# Patient Record
Sex: Female | Born: 1940 | Race: White | Hispanic: No | Smoking: Never smoker
Health system: Southern US, Community
[De-identification: ages and names within clinical notes are randomized; demographics above are authoritative.]

## PROBLEM LIST (undated history)

## (undated) DIAGNOSIS — I10 Essential (primary) hypertension: Secondary | ICD-10-CM

## (undated) DIAGNOSIS — Z972 Presence of dental prosthetic device (complete) (partial): Secondary | ICD-10-CM

## (undated) DIAGNOSIS — K219 Gastro-esophageal reflux disease without esophagitis: Secondary | ICD-10-CM

## (undated) DIAGNOSIS — K279 Peptic ulcer, site unspecified, unspecified as acute or chronic, without hemorrhage or perforation: Secondary | ICD-10-CM

## (undated) DIAGNOSIS — E7800 Pure hypercholesterolemia, unspecified: Secondary | ICD-10-CM

## (undated) DIAGNOSIS — Z973 Presence of spectacles and contact lenses: Secondary | ICD-10-CM

## (undated) DIAGNOSIS — K859 Acute pancreatitis without necrosis or infection, unspecified: Secondary | ICD-10-CM

## (undated) DIAGNOSIS — R7303 Prediabetes: Secondary | ICD-10-CM

## (undated) DIAGNOSIS — N289 Disorder of kidney and ureter, unspecified: Secondary | ICD-10-CM

## (undated) DIAGNOSIS — N200 Calculus of kidney: Secondary | ICD-10-CM

## (undated) DIAGNOSIS — K08109 Complete loss of teeth, unspecified cause, unspecified class: Secondary | ICD-10-CM

## (undated) HISTORY — DX: Disorder of kidney and ureter, unspecified: N28.9

## (undated) HISTORY — PX: HERNIA REPAIR: SHX51

## (undated) HISTORY — PX: APPENDECTOMY: SHX54

## (undated) HISTORY — PX: TUBAL LIGATION: SHX77

## (undated) HISTORY — PX: ABDOMINAL HYSTERECTOMY: SHX81

## (undated) HISTORY — DX: Calculus of kidney: N20.0

## (undated) HISTORY — PX: CHOLECYSTECTOMY: SHX55

## (undated) HISTORY — DX: Acute pancreatitis without necrosis or infection, unspecified: K85.90

## (undated) HISTORY — DX: Peptic ulcer, site unspecified, unspecified as acute or chronic, without hemorrhage or perforation: K27.9

---

## 2000-05-31 ENCOUNTER — Emergency Department (HOSPITAL_COMMUNITY): Admission: EM | Admit: 2000-05-31 | Discharge: 2000-05-31 | Payer: Self-pay | Admitting: Emergency Medicine

## 2000-05-31 ENCOUNTER — Encounter: Payer: Self-pay | Admitting: Emergency Medicine

## 2000-06-11 ENCOUNTER — Other Ambulatory Visit: Admission: RE | Admit: 2000-06-11 | Discharge: 2000-06-11 | Payer: Self-pay | Admitting: Surgery

## 2000-06-11 ENCOUNTER — Encounter (INDEPENDENT_AMBULATORY_CARE_PROVIDER_SITE_OTHER): Payer: Self-pay | Admitting: Specialist

## 2009-02-09 ENCOUNTER — Encounter: Admission: RE | Admit: 2009-02-09 | Discharge: 2009-02-09 | Payer: Self-pay | Admitting: Family Medicine

## 2009-02-10 ENCOUNTER — Encounter: Admission: RE | Admit: 2009-02-10 | Discharge: 2009-02-10 | Payer: Self-pay | Admitting: Family Medicine

## 2009-02-10 ENCOUNTER — Encounter: Payer: Self-pay | Admitting: Pulmonary Disease

## 2009-03-02 ENCOUNTER — Ambulatory Visit: Payer: Self-pay | Admitting: Pulmonary Disease

## 2009-03-02 DIAGNOSIS — R930 Abnormal findings on diagnostic imaging of skull and head, not elsewhere classified: Secondary | ICD-10-CM | POA: Insufficient documentation

## 2009-03-02 DIAGNOSIS — E785 Hyperlipidemia, unspecified: Secondary | ICD-10-CM

## 2009-03-02 DIAGNOSIS — I10 Essential (primary) hypertension: Secondary | ICD-10-CM | POA: Insufficient documentation

## 2009-04-08 ENCOUNTER — Encounter: Admission: RE | Admit: 2009-04-08 | Discharge: 2009-04-08 | Payer: Self-pay | Admitting: Family Medicine

## 2009-09-20 ENCOUNTER — Encounter: Admission: RE | Admit: 2009-09-20 | Discharge: 2009-09-20 | Payer: Self-pay | Admitting: Family Medicine

## 2010-10-29 IMAGING — CR DG CHEST 2V
2 series · 2 of 2 positions shown · non-contrast
Comparison: None.

CLINICAL DATA: Cough.

CHEST - 2 VIEW

[view not recorded (1 of 2)]
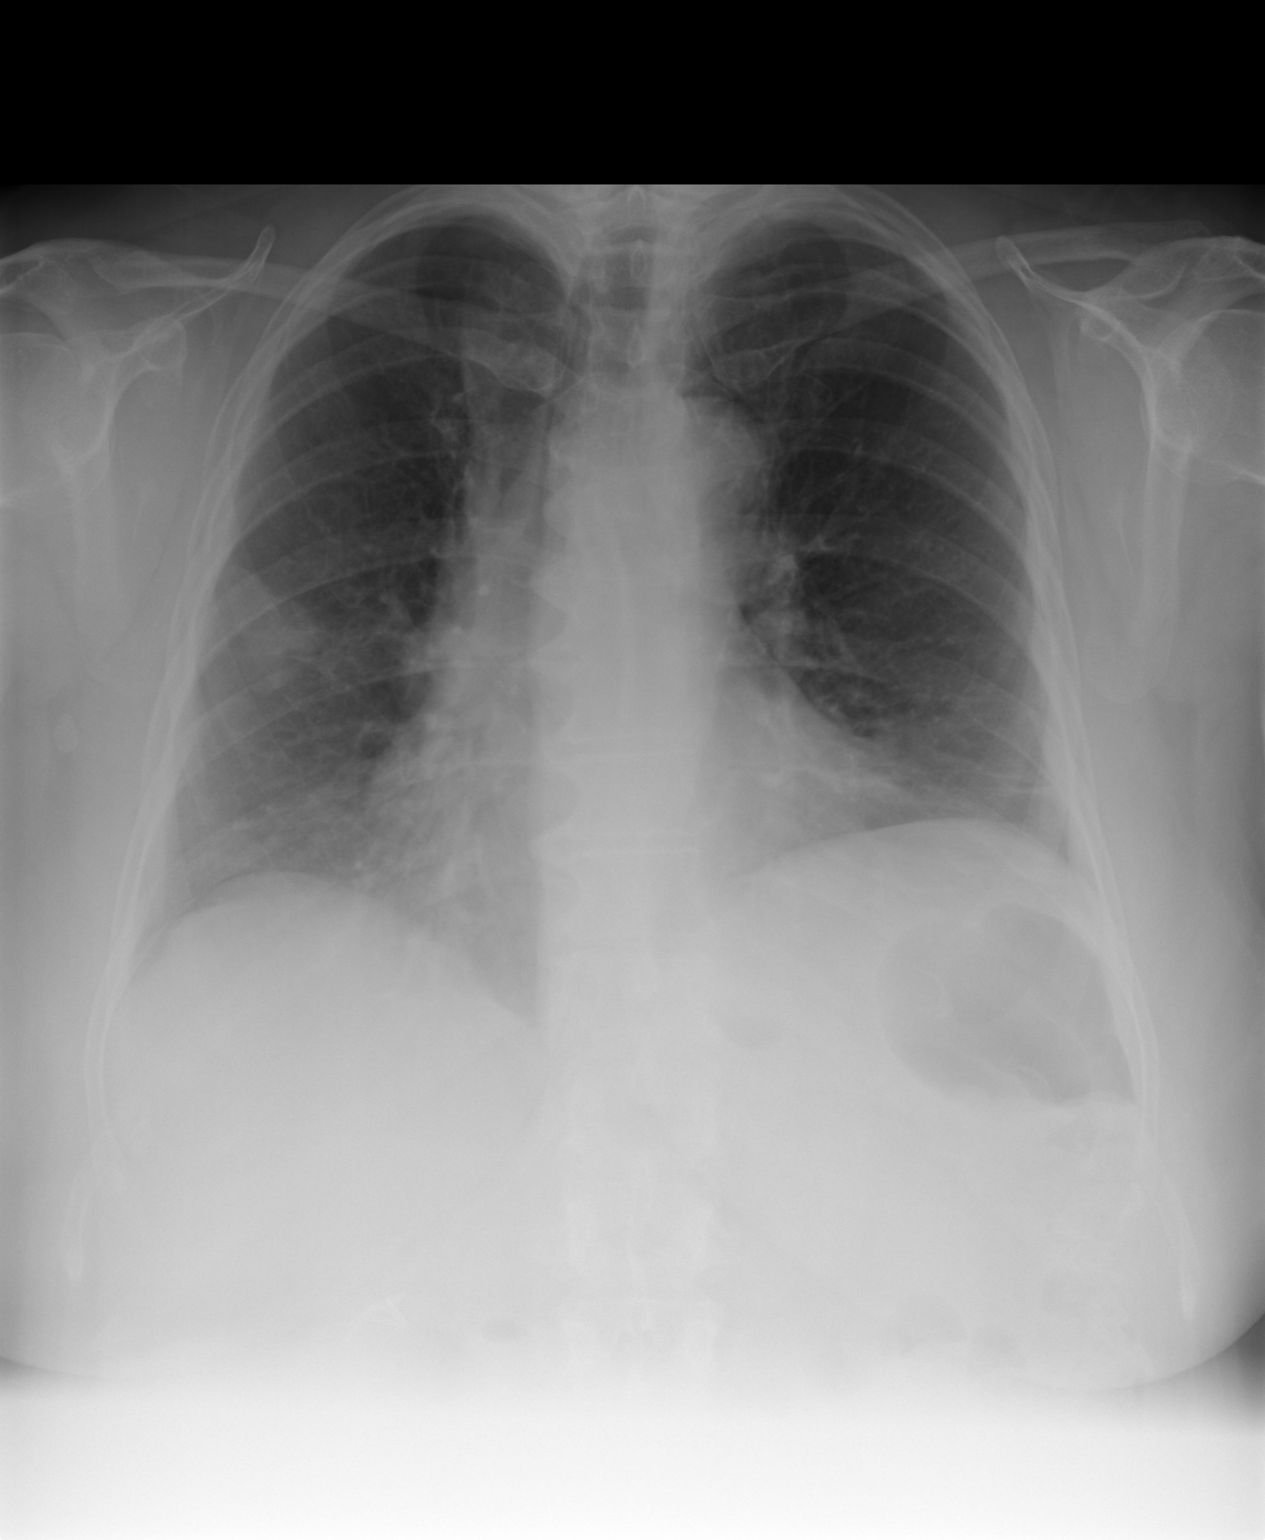

[view not recorded (2 of 2)]
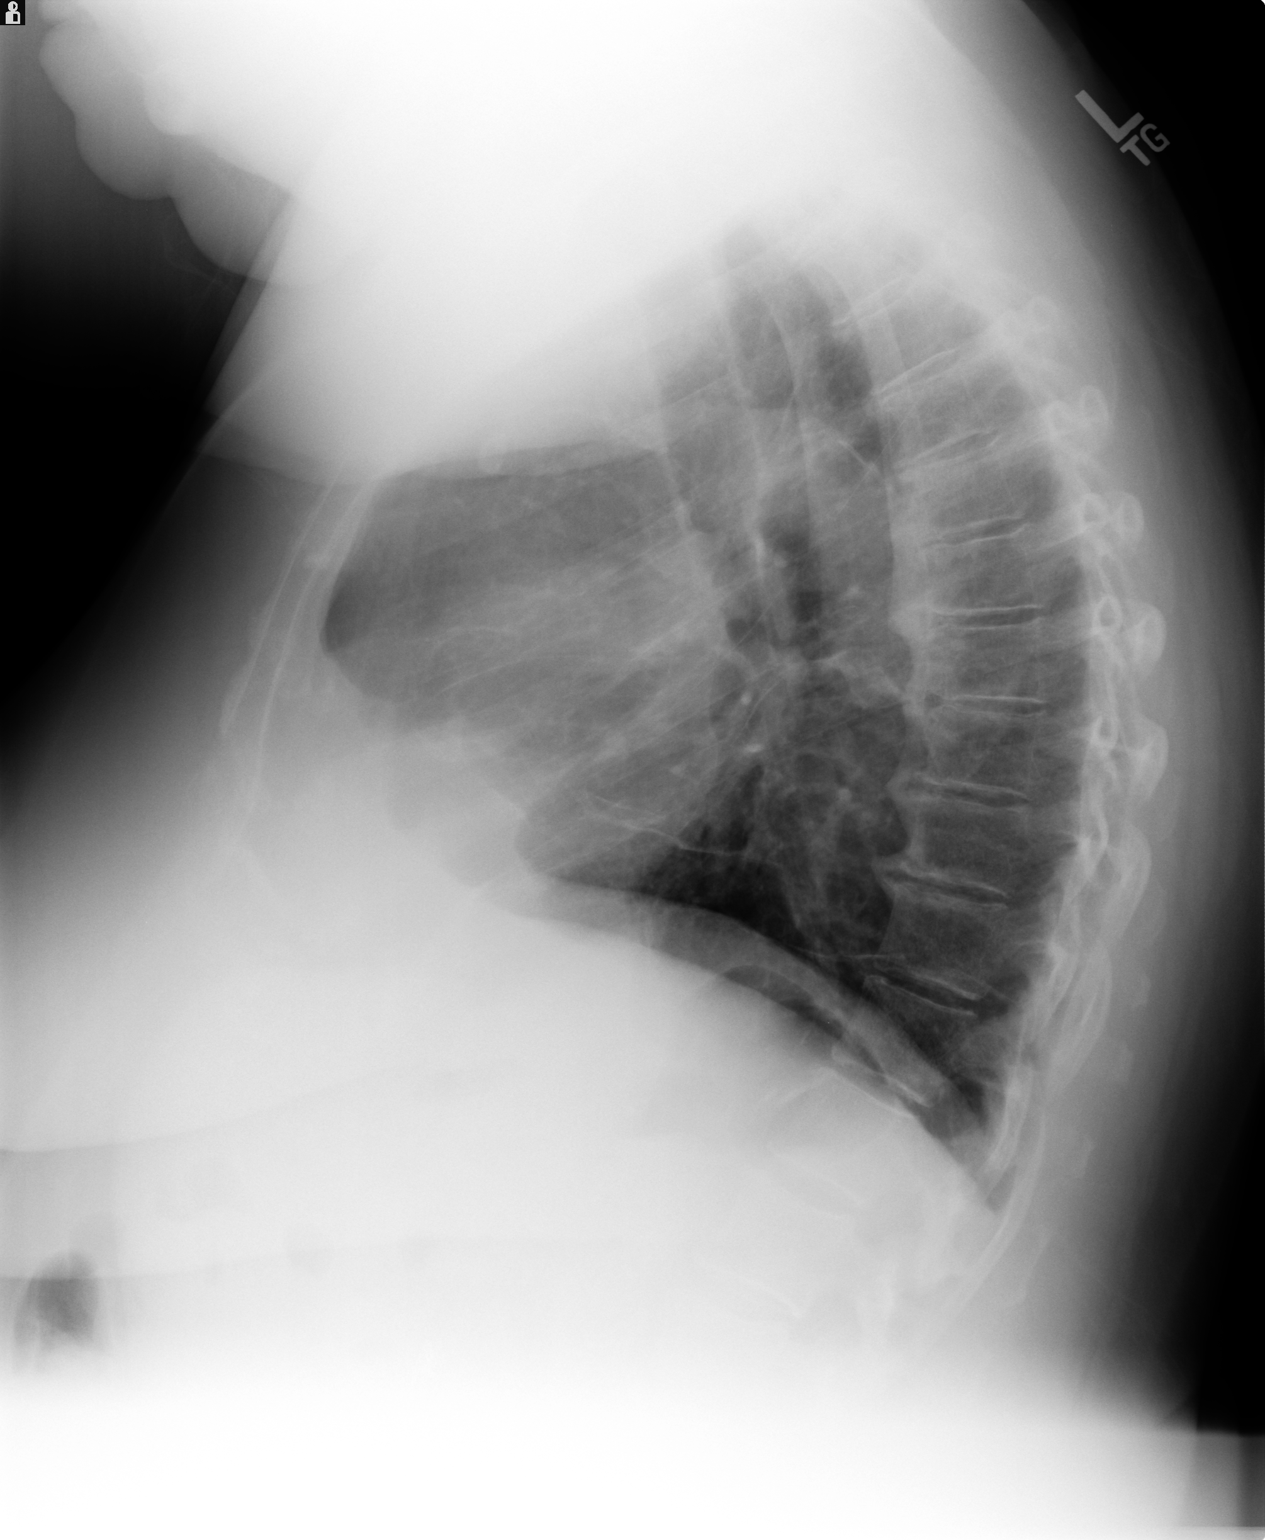

[2 of 2 positions shown; findings below may reference images not displayed]

FINDINGS: Trachea is midline.  Heart is at the upper limits of
normal in size, to mildly enlarged.  Linear densities in the
lingula are likely due to scarring, adjacent to an elevated left
hemidiaphragm.  There is a rounded opacity in the lateral right mid
lung zone.  Lungs are otherwise clear.  No pleural fluid.
IMPRESSION: Round density in the right mid lung zone.  A mass is considered.
Comparison with prior exams would be helpful.  If old exams are not
available, chest CT with contrast is recommended.  This was
discussed with Kebeli, in Dr. [REDACTED], at 8533 hours on
02/09/2009.

## 2012-01-01 ENCOUNTER — Other Ambulatory Visit: Payer: Self-pay | Admitting: Rheumatology

## 2012-01-01 DIAGNOSIS — M25562 Pain in left knee: Secondary | ICD-10-CM

## 2012-01-03 ENCOUNTER — Ambulatory Visit
Admission: RE | Admit: 2012-01-03 | Discharge: 2012-01-03 | Disposition: A | Discharge status: Home / Self Care | Payer: Medicare Other | Source: Ambulatory Visit | Attending: Rheumatology | Admitting: Rheumatology

## 2012-01-03 DIAGNOSIS — M25562 Pain in left knee: Secondary | ICD-10-CM

## 2012-01-09 ENCOUNTER — Encounter (HOSPITAL_COMMUNITY): Payer: Self-pay | Admitting: Emergency Medicine

## 2012-01-09 ENCOUNTER — Emergency Department (HOSPITAL_COMMUNITY): Payer: Medicare Other

## 2012-01-09 ENCOUNTER — Inpatient Hospital Stay (HOSPITAL_COMMUNITY)
Admission: EM | Admit: 2012-01-09 | Discharge: 2012-01-13 | DRG: 439 | Disposition: A | Discharge status: Home / Self Care | Payer: Medicare Other | Attending: Internal Medicine | Admitting: Internal Medicine

## 2012-01-09 DIAGNOSIS — K219 Gastro-esophageal reflux disease without esophagitis: Secondary | ICD-10-CM | POA: Diagnosis present

## 2012-01-09 DIAGNOSIS — E785 Hyperlipidemia, unspecified: Secondary | ICD-10-CM | POA: Diagnosis present

## 2012-01-09 DIAGNOSIS — K859 Acute pancreatitis without necrosis or infection, unspecified: Principal | ICD-10-CM

## 2012-01-09 DIAGNOSIS — R109 Unspecified abdominal pain: Secondary | ICD-10-CM

## 2012-01-09 DIAGNOSIS — K560 Paralytic ileus: Secondary | ICD-10-CM | POA: Diagnosis present

## 2012-01-09 DIAGNOSIS — E7800 Pure hypercholesterolemia, unspecified: Secondary | ICD-10-CM | POA: Diagnosis present

## 2012-01-09 DIAGNOSIS — R911 Solitary pulmonary nodule: Secondary | ICD-10-CM | POA: Diagnosis present

## 2012-01-09 DIAGNOSIS — Z79899 Other long term (current) drug therapy: Secondary | ICD-10-CM

## 2012-01-09 DIAGNOSIS — E669 Obesity, unspecified: Secondary | ICD-10-CM | POA: Diagnosis present

## 2012-01-09 DIAGNOSIS — D72829 Elevated white blood cell count, unspecified: Secondary | ICD-10-CM | POA: Diagnosis present

## 2012-01-09 DIAGNOSIS — Z23 Encounter for immunization: Secondary | ICD-10-CM

## 2012-01-09 DIAGNOSIS — Z6841 Body Mass Index (BMI) 40.0 and over, adult: Secondary | ICD-10-CM

## 2012-01-09 DIAGNOSIS — I10 Essential (primary) hypertension: Secondary | ICD-10-CM | POA: Diagnosis present

## 2012-01-09 DIAGNOSIS — D1779 Benign lipomatous neoplasm of other sites: Secondary | ICD-10-CM | POA: Diagnosis present

## 2012-01-09 DIAGNOSIS — R930 Abnormal findings on diagnostic imaging of skull and head, not elsewhere classified: Secondary | ICD-10-CM

## 2012-01-09 HISTORY — DX: Pure hypercholesterolemia, unspecified: E78.00

## 2012-01-09 HISTORY — DX: Gastro-esophageal reflux disease without esophagitis: K21.9

## 2012-01-09 HISTORY — DX: Prediabetes: R73.03

## 2012-01-09 HISTORY — DX: Essential (primary) hypertension: I10

## 2012-01-09 LAB — CBC WITH DIFFERENTIAL/PLATELET
Basophils Relative: 0 % (ref 0–1)
Eosinophils Absolute: 0.1 10*3/uL (ref 0.0–0.7)
Eosinophils Relative: 1 % (ref 0–5)
MCH: 27.8 pg (ref 26.0–34.0)
MCHC: 33.1 g/dL (ref 30.0–36.0)
MCV: 84 fL (ref 78.0–100.0)
Monocytes Relative: 7 % (ref 3–12)
Neutrophils Relative %: 86 % — ABNORMAL HIGH (ref 43–77)
Platelets: 238 10*3/uL (ref 150–400)

## 2012-01-09 LAB — COMPREHENSIVE METABOLIC PANEL
Albumin: 3.4 g/dL — ABNORMAL LOW (ref 3.5–5.2)
Alkaline Phosphatase: 81 U/L (ref 39–117)
BUN: 26 mg/dL — ABNORMAL HIGH (ref 6–23)
Calcium: 9.5 mg/dL (ref 8.4–10.5)
GFR calc Af Amer: 62 mL/min — ABNORMAL LOW (ref >90)
Potassium: 3.8 mEq/L (ref 3.5–5.1)
Sodium: 133 mEq/L — ABNORMAL LOW (ref 135–145)
Total Protein: 7.4 g/dL (ref 6.0–8.3)

## 2012-01-09 LAB — URINALYSIS, MICROSCOPIC ONLY
Ketones, ur: NEGATIVE mg/dL
Nitrite: NEGATIVE
Protein, ur: 30 mg/dL — AB

## 2012-01-09 LAB — CBC
HCT: 35.6 % — ABNORMAL LOW (ref 36.0–46.0)
Platelets: 184 10*3/uL (ref 150–400)
RDW: 16.5 % — ABNORMAL HIGH (ref 11.5–15.5)
WBC: 19.8 10*3/uL — ABNORMAL HIGH (ref 4.0–10.5)

## 2012-01-09 LAB — LIPID PANEL
LDL Cholesterol: 95 mg/dL (ref 0–99)
Total CHOL/HDL Ratio: 2.5 RATIO

## 2012-01-09 LAB — LIPASE, BLOOD: Lipase: 201 U/L — ABNORMAL HIGH (ref 11–59)

## 2012-01-09 LAB — CREATININE, SERUM
GFR calc Af Amer: 71 mL/min — ABNORMAL LOW (ref >90)
GFR calc non Af Amer: 62 mL/min — ABNORMAL LOW (ref >90)

## 2012-01-09 MED ORDER — SIMVASTATIN 20 MG PO TABS
20.0000 mg | ORAL_TABLET | Freq: Every day | ORAL | Status: DC
  Administered 2012-01-09 – 2012-01-12 (×4): 20 mg via ORAL
  Filled 2012-01-09 (×5): qty 1

## 2012-01-09 MED ORDER — MORPHINE SULFATE 4 MG/ML IJ SOLN
4.0000 mg | INTRAMUSCULAR | Status: DC | PRN
  Administered 2012-01-09 – 2012-01-12 (×10): 4 mg via INTRAVENOUS
  Filled 2012-01-09 (×10): qty 1

## 2012-01-09 MED ORDER — PNEUMOCOCCAL VAC POLYVALENT 25 MCG/0.5ML IJ INJ
0.5000 mL | INJECTION | Freq: Once | INTRAMUSCULAR | Status: AC
  Administered 2012-01-10: 0.5 mL via INTRAMUSCULAR
  Filled 2012-01-09: qty 0.5

## 2012-01-09 MED ORDER — HYDROCODONE-ACETAMINOPHEN 5-325 MG PO TABS
1.0000 | ORAL_TABLET | ORAL | Status: DC | PRN
  Administered 2012-01-10: 1 via ORAL
  Filled 2012-01-09: qty 1

## 2012-01-09 MED ORDER — HYDROMORPHONE HCL PF 1 MG/ML IJ SOLN
0.5000 mg | Freq: Once | INTRAMUSCULAR | Status: AC
  Administered 2012-01-09: 0.5 mg via INTRAVENOUS
  Filled 2012-01-09: qty 1

## 2012-01-09 MED ORDER — AMLODIPINE BESYLATE 5 MG PO TABS
5.0000 mg | ORAL_TABLET | Freq: Every day | ORAL | Status: DC
  Administered 2012-01-09 – 2012-01-13 (×5): 5 mg via ORAL
  Filled 2012-01-09 (×5): qty 1

## 2012-01-09 MED ORDER — ENOXAPARIN SODIUM 40 MG/0.4ML ~~LOC~~ SOLN
40.0000 mg | SUBCUTANEOUS | Status: DC
  Administered 2012-01-09 – 2012-01-13 (×5): 40 mg via SUBCUTANEOUS
  Filled 2012-01-09 (×6): qty 0.4

## 2012-01-09 MED ORDER — GI COCKTAIL ~~LOC~~
30.0000 mL | Freq: Once | ORAL | Status: AC
  Administered 2012-01-09: 30 mL via ORAL
  Filled 2012-01-09: qty 30

## 2012-01-09 MED ORDER — PANTOPRAZOLE SODIUM 40 MG IV SOLR
40.0000 mg | INTRAVENOUS | Status: DC
  Administered 2012-01-09 – 2012-01-13 (×5): 40 mg via INTRAVENOUS
  Filled 2012-01-09 (×5): qty 40

## 2012-01-09 MED ORDER — DIPHENHYDRAMINE HCL 50 MG/ML IJ SOLN
25.0000 mg | Freq: Once | INTRAMUSCULAR | Status: AC
  Administered 2012-01-09: 25 mg via INTRAVENOUS
  Filled 2012-01-09: qty 1

## 2012-01-09 MED ORDER — ONDANSETRON HCL 4 MG/2ML IJ SOLN
4.0000 mg | Freq: Once | INTRAMUSCULAR | Status: AC
  Administered 2012-01-09: 4 mg via INTRAVENOUS
  Filled 2012-01-09: qty 2

## 2012-01-09 MED ORDER — IRBESARTAN 150 MG PO TABS
150.0000 mg | ORAL_TABLET | Freq: Every day | ORAL | Status: DC
  Administered 2012-01-10 – 2012-01-13 (×4): 150 mg via ORAL
  Filled 2012-01-09 (×5): qty 1

## 2012-01-09 MED ORDER — ONDANSETRON HCL 4 MG/2ML IJ SOLN
4.0000 mg | Freq: Four times a day (QID) | INTRAMUSCULAR | Status: DC | PRN
  Administered 2012-01-09 – 2012-01-12 (×2): 4 mg via INTRAVENOUS
  Filled 2012-01-09 (×2): qty 2

## 2012-01-09 MED ORDER — ACETAMINOPHEN 650 MG RE SUPP: 650.0000 mg | Freq: Four times a day (QID) | RECTAL | Status: DC | PRN

## 2012-01-09 MED ORDER — SODIUM CHLORIDE 0.9 % IV SOLN
INTRAVENOUS | Status: DC
  Administered 2012-01-09 – 2012-01-12 (×7): via INTRAVENOUS

## 2012-01-09 MED ORDER — ACETAMINOPHEN 325 MG PO TABS: 650.0000 mg | ORAL_TABLET | Freq: Four times a day (QID) | ORAL | Status: DC | PRN

## 2012-01-09 MED ORDER — MORPHINE SULFATE 4 MG/ML IJ SOLN
4.0000 mg | Freq: Once | INTRAMUSCULAR | Status: AC
  Administered 2012-01-09: 4 mg via INTRAVENOUS
  Filled 2012-01-09: qty 1

## 2012-01-09 NOTE — ED Notes (Signed)
Patient transported to CT 

## 2012-01-09 NOTE — ED Notes (Signed)
PT back from CT.  States pain not improved.  Pt w/localized welts and redness to IV site.

## 2012-01-09 NOTE — ED Notes (Signed)
Went to give pt medication but she is at [employer].

## 2012-01-09 NOTE — ED Notes (Signed)
Pt c/o epigastric pain and pain in mid back onset yesterday.  St's she has hx of acid reflux and has been on a lot of meds recently due to a knee injury.  Pt also c/o a bitter taste in mouth

## 2012-01-09 NOTE — ED Notes (Signed)
Pt states pain not relieved.  Dr Norlene Campbell notified.  Redness/welts reduced.

## 2012-01-09 NOTE — H&P (Signed)
[facility] History and Physical  [name] [name] JYN:[id number] DOB: [birth year] DOA: 01/09/2012  Referring physician: ED physician PCP: Sissy Hoff, MD   Chief Complaint: Abdominal pain  HPI:  This is a 71 year old female with a 30 hour history of abdominal pain. She was in her usual state of health until this period of time. Her abdominal pain is epigastric and radiates to her back. She denies any vomiting but complains of nausea. She denies any illegal drug use or alcohol. She has not started any new medications. There are no fevers, chills, chest pain, shortness of breath, rashes, hematochezia, melena, headaches, dysphagia, or odynophagia. She had a cholecystectomy in January 2002. In the emergency R., the patient received hydromorphone for pain. Her current pain level is 4/10. She denies any dysuria or hematuria.  Assessment and Plan: #1 acute pancreatitis. This is likely due to hydrochlorothiazide. The patient is status post cholecystectomy. The patient will be started on intravenous fluids as well as intravenous pain medications. The patient states that she can tolerate some clear liquids, and I will start. I will convert her to n.p.o. should she start having vomiting. CT of the abdomen and pelvis did not show any biliary ductal dilatation. #2 leukocytosis. This is likely reactive to the pancreatitis. Blood cultures and urine cultures will be obtained #3 hyperlipidemia-a lipid panel will be checked for possible cause of her pancreatitis #4 hypertension. The patient has not taken her blood pressure medicines today. I will stop hydrochlorothiazide but continue her angiotensin receptor blocker. I will add amlodipine for better blood pressure control.  Code Status: Full Family Communication: Pt at bedside Disposition Plan: PT evaluation    Review of Systems:  Constitutional: Negative for fever, chills and malaise/fatigue. Negative for diaphoresis.  HENT: Negative for hearing loss,  ear pain, nosebleeds, congestion, sore throat, neck pain, tinnitus and ear discharge.   Eyes: Negative for blurred vision, double vision, photophobia, pain, discharge and redness.  Respiratory: Negative for cough, hemoptysis, sputum production, shortness of breath, wheezing and stridor.   Cardiovascular: Negative for chest pain, palpitations, orthopnea, claudication and leg swelling.  Gastrointestinal: Negative for nausea,. Negative for heartburn, constipation, blood in stool and melena.  Genitourinary: Negative for dysuria, urgency, frequency, hematuria and flank pain.  Musculoskeletal: Negative for myalgias, back pain, joint pain and falls.  Skin: Negative for itching and rash.  Neurological: Negative for dizziness and weakness. Negative for tingling, tremors, sensory change, speech change, focal weakness, loss of consciousness and headaches.  Endo/Heme/Allergies: Negative for environmental allergies and polydipsia. Does not bruise/bleed easily.  Psychiatric/Behavioral: Negative for suicidal ideas. The patient is not nervous/anxious.      Past Medical History  Diagnosis Date  . GERD (gastroesophageal reflux disease)   . Hypertension   . High cholesterol     Past Surgical History  Procedure Date  . Cholecystectomy     Social History:  reports that she has never smoked. She does not have any smokeless tobacco history on file. She reports that she does not drink alcohol or use illicit drugs.  Allergies  Allergen Reactions  . Iohexol Hives  . Other Other (See Comments)    Iv dye-hives     No family history on file.  Prior to Admission medications   Medication Sig Start Date End Date Taking? Authorizing Provider  hydrochlorothiazide (HYDRODIURIL) 25 MG tablet Take 25 mg by mouth daily.   Yes Historical Provider, MD  HYDROcodone-acetaminophen (VICODIN) 5-500 MG per tablet Take 1 tablet by mouth every 6 (six) hours  as needed. For pain   Yes Historical Provider, MD  Multiple Vitamin  (MULTIVITAMIN WITH MINERALS) TABS Take 1 tablet by mouth daily.   Yes Historical Provider, MD  olmesartan (BENICAR) 20 MG tablet Take 20 mg by mouth daily.   Yes Historical Provider, MD  omeprazole (PRILOSEC) 20 MG capsule Take 20 mg by mouth daily as needed. For acid reflux   Yes Historical Provider, MD  simvastatin (ZOCOR) 20 MG tablet Take 20 mg by mouth every evening.   Yes Historical Provider, MD    Physical Exam: Filed Vitals:   01/09/12 0148 01/09/12 0519 01/09/12 0831  BP: 167/66 161/89 178/70  Pulse: 93 92 87  Temp: 98.1 F (36.7 C) 97.3 F (36.3 C)   TempSrc:  Oral   Resp: 17 19 14   SpO2: 95% 100% 98%    Physical Exam  Constitutional: Appears well-developed and well-nourished. No distress.  HENT: Normocephalic. External right and left ear normal. Oropharynx is clear and moist.  Eyes: Conjunctivae and EOM are normal. PERRLA, no scleral icterus.  Neck: Normal ROM. Neck supple. No JVD. No tracheal deviation. No thyromegaly.  CVS: RRR, S1/S2 +, no murmurs, no gallops, no carotid bruit.  Pulmonary: Effort and breath sounds normal, no stridor, rhonchi, wheezes, rales.  Abdominal: Soft. BS +,  no distension, tenderness, rebound or guarding.  Musculoskeletal: Normal range of motion. Trace edema in the bilateral lower extremities. Mild left knee pain without effusion  Lymphadenopathy: No lymphadenopathy noted, cervical, inguinal. Neuro: Alert. Normal reflexes, muscle tone coordination. No cranial nerve deficit. Skin: Skin is warm and dry. No rash noted. Not diaphoretic. No erythema. No pallor.  Psychiatric: Normal mood and affect. Behavior, judgment, thought content normal.   Labs on Admission:  Basic Metabolic Panel:  Lab 01/09/12 1610  NA 133*  K 3.8  CL 96  CO2 22  GLUCOSE 132*  BUN 26*  CREATININE 1.02  CALCIUM 9.5  MG --  PHOS --   Liver Function Tests:  Lab 01/09/12 0443  AST 13  ALT 11  ALKPHOS 81  BILITOT 0.3  PROT 7.4  ALBUMIN 3.4*    Lab 01/09/12  0443  LIPASE 201*  AMYLASE --   No results found for this basename: AMMONIA:5 in the last 168 hours CBC:  Lab 01/09/12 0443  WBC 18.6*  NEUTROABS 16.0*  HGB 11.8*  HCT 35.7*  MCV 84.0  PLT 238   Cardiac Enzymes: No results found for this basename: CKTOTAL:5,CKMB:5,CKMBINDEX:5,TROPONINI:5 in the last 168 hours BNP: No components found with this basename: POCBNP:5 CBG: No results found for this basename: GLUCAP:5 in the last 168 hours  Radiological Exams on Admission: Ct Abdomen Pelvis Wo Contrast  01/09/2012  *RADIOLOGY REPORT*  Clinical Data: Epigastric pain and back pain.  Burning sensation. Elevated lipase.  Elevated white cell count.  Allergy to IV contrast material is reported.  CT ABDOMEN AND PELVIS WITHOUT CONTRAST  Technique:  Multidetector CT imaging of the abdomen and pelvis was performed following the standard protocol without intravenous contrast.  Comparison: None.  Findings: The lung bases are clear.  Coronary artery calcification. Small esophageal hiatal hernia.  There is infiltration in the fat around the head of the pancreas with infiltration extending to the anterior pararenal spaces bilaterally.  Changes are consistent with early pancreatitis.  No pancreatic ductal dilatation.  Mild fatty infiltration of the pancreas.  Surgical absence of the gallbladder.  No bile duct dilatation. Liver, spleen, and adrenal glands are unremarkable.  Calcification of abdominal aorta without aneurysm.  Staghorn calculi in the lower pole right kidney without obstruction.  No hydronephrosis in either kidney.  Hypo attenuating lesion in the left kidney measures 11 mm and probably represents a cyst.  No retroperitoneal lymphadenopathy.  Scattered mesenteric and celiac axis lymph nodes are not enlarged and probably are reactive.  Small midline abdominal wall hernia containing fat with scarring or fat necrosis. Prominent visceral adipose tissues.  No free fluid or free air in the abdomen.  The  stomach, small bowel, and colon are decompressed.  Pelvis:  Uterus is surgically absent.  No abnormal adnexal masses. No bladder wall thickening.  No free or loculated pelvic fluid collections.  Calcifications in the pelvis consistent with phleboliths.  No diverticulitis.  The appendix is not identified. The bowel, spondylolysis and mild spondylolisthesis at L5-S1. Degenerative changes in the spine.  IMPRESSION: Inflammatory changes around the head of the pancreas consistent with early pancreatitis.  No evidence of abscess or fluid collection.  Small midline abdominal wall hernia superiorly with scarring or fat necrosis.  Staghorn type nonobstructing stone in the right kidney.  Original Report Authenticated By: Marlon Pel, M.D.   Dg Abd Acute W/chest  01/09/2012  *RADIOLOGY REPORT*  Clinical Data: Upper abdominal back pain and nausea.  ACUTE ABDOMEN SERIES (ABDOMEN 2 VIEW & CHEST 1 VIEW)  Comparison: 02/09/2009 chest x-ray  Findings: Shallow inspiration.  Linear fibrosis in the left lung base, stable.  Nodular opacity over the right mid lung is stable since the previous study and shown to represent a pleural fat collection on CT from 09/20/2009.  No focal airspace consolidation in the lungs.  No blunting of costophrenic angles.  No pneumothorax.  Mediastinal contours appear intact.  Normal heart size and pulmonary vascularity.  No significant change since previous chest radiograph.  Normal bowel gas pattern with scattered gas and stool in the colon. No small or large bowel distension.  Surgical clips in the right upper quadrant.  No free intra-abdominal air.  No abnormal air fluid levels.  Increased density in the right upper quadrant may represent renal stones.  This is stable since previous study. Degenerative changes in the spine and hips.  Calcified phleboliths in the pelvis.  IMPRESSION: Stable appearance of the chest with chronic changes as described. No evidence of active pulmonary disease.   Nonobstructive bowel gas pattern.  Calcifications in the right abdomen may represent renal stones and are stable since previous study.  No free air.  Original Report Authenticated By: Marlon Pel, M.D.    EKG: Normal sinus rhythm, no ST/T wave changes  Terrin Imparato, MD  [facility] Pager [phone]  If 7PM-7AM, please contact night-coverage [email or web address] Password [facility] 01/09/2012, 8:55 AM

## 2012-01-09 NOTE — Progress Notes (Signed)
Arrived to room 6N#2.  A/ox4, daughter at bedside, c/o abd pain 5/10 denies nausea, moved self from stretcher to bed without difficulty. Oriented to room and surroundings.

## 2012-01-09 NOTE — ED Provider Notes (Addendum)
History     CSN: [id number]  Arrival date & time 01/09/12  0146   First MD Initiated Contact with Patient 01/09/12 [phone]      Chief Complaint  Patient presents with  . Abdominal Pain    (Consider location/radiation/quality/duration/timing/severity/associated sxs/prior treatment) HPI 71 year old female presents to emergency room complaining of upper abdominal pain with radiation into her back. Patient reports she woke yesterday morning around 4 AM with acute onset of pain. She has had nausea but no vomiting. She had normal bowel movement yesterday. She denies any fever or chills. No prior history of similar pain. Patient feels loaded with a distended stomach. Patient does have a history of GERD, but reports this is much worse in any prior attempts of GERD. Patient has tried Designer, fashion/clothing and Maisie Fus without improvement. Patient denies any recent NSAID use. No prior history of gastric ulcers, no history of pancreatitis, no history of aortic aneurysms. Patient has history of cholecystectomy, appendectomy, and hysterectomy. No prior history of small bowel obstruction.  Past Medical History  Diagnosis Date  . GERD (gastroesophageal reflux disease)   . Hypertension   . High cholesterol     Past Surgical History  Procedure Date  . Cholecystectomy     No family history on file.  History  Substance Use Topics  . Smoking status: Never Smoker   . Smokeless tobacco: Not on file  . Alcohol Use: No    OB History    Grav Para Term Preterm Abortions TAB SAB Ect Mult Living                  Review of Systems  All other systems reviewed and are negative.    Allergies  Iohexol and Other  Home Medications   Current Outpatient Rx  Name Route Sig Dispense Refill  . HYDROCHLOROTHIAZIDE 25 MG PO TABS Oral Take 25 mg by mouth daily.    Marland Kitchen HYDROCODONE-ACETAMINOPHEN 5-500 MG PO TABS Oral Take 1 tablet by mouth every 6 (six) hours as needed. For pain    . ADULT MULTIVITAMIN W/MINERALS CH Oral  Take 1 tablet by mouth daily.    Marland Kitchen OLMESARTAN MEDOXOMIL 20 MG PO TABS Oral Take 20 mg by mouth daily.    Marland Kitchen OMEPRAZOLE 20 MG PO CPDR Oral Take 20 mg by mouth daily as needed. For acid reflux    . SIMVASTATIN 20 MG PO TABS Oral Take 20 mg by mouth every evening.      BP 161/89  Pulse 92  Temp 97.3 F (36.3 C) (Oral)  Resp 19  SpO2 100%  Physical Exam  Nursing note and vitals reviewed. Constitutional: She appears well-developed and well-nourished.       Obese, uncomfortable appearing  HENT:  Head: Normocephalic and atraumatic.  Nose: Nose normal.  Mouth/Throat: Oropharynx is clear and moist.  Eyes: Conjunctivae and EOM are normal. Pupils are equal, round, and reactive to light.  Neck: Normal range of motion. Neck supple. No JVD present. No tracheal deviation present. No thyromegaly present.  Cardiovascular: Normal rate, regular rhythm, normal heart sounds and intact distal pulses.  Exam reveals no gallop and no friction rub.   No murmur heard. Pulmonary/Chest: Effort normal and breath sounds normal. No stridor. No respiratory distress. She has no wheezes. She has no rales. She exhibits no tenderness.  Abdominal: Soft. Bowel sounds are normal. She exhibits distension. She exhibits no mass. There is tenderness (exquisite tenderness in epigastrium with voluntary guarding but no rebound). There is guarding. There is no  rebound.  Musculoskeletal: Normal range of motion. She exhibits tenderness (pain with palpation of left knee). She exhibits no edema.  Lymphadenopathy:    She has no cervical adenopathy.  Neurological: She is alert. She exhibits normal muscle tone. Coordination normal.  Skin: Skin is dry. No rash noted. No erythema. No pallor.  Psychiatric: She has a normal mood and affect. Her behavior is normal. Judgment and thought content normal.    ED Course  Procedures (including critical care time)  Labs Reviewed  CBC WITH DIFFERENTIAL - Abnormal; Notable for the following:     WBC 18.6 (*)     Hemoglobin 11.8 (*)     HCT 35.7 (*)     RDW 16.6 (*)     Neutrophils Relative 86 (*)     Neutro Abs 16.0 (*)     Lymphocytes Relative 6 (*)     Monocytes Absolute 1.3 (*)     All other components within normal limits  COMPREHENSIVE METABOLIC PANEL - Abnormal; Notable for the following:    Sodium 133 (*)     Glucose, Bld 132 (*)     BUN 26 (*)     Albumin 3.4 (*)     GFR calc non Af Amer 54 (*)     GFR calc Af Amer 62 (*)     All other components within normal limits  LIPASE, BLOOD - Abnormal; Notable for the following:    Lipase 201 (*)     All other components within normal limits  LACTIC ACID, PLASMA   Ct Abdomen Pelvis Wo Contrast  01/09/2012  *RADIOLOGY REPORT*  Clinical Data: Epigastric pain and back pain.  Burning sensation. Elevated lipase.  Elevated white cell count.  Allergy to IV contrast material is reported.  CT ABDOMEN AND PELVIS WITHOUT CONTRAST  Technique:  Multidetector CT imaging of the abdomen and pelvis was performed following the standard protocol without intravenous contrast.  Comparison: None.  Findings: The lung bases are clear.  Coronary artery calcification. Small esophageal hiatal hernia.  There is infiltration in the fat around the head of the pancreas with infiltration extending to the anterior pararenal spaces bilaterally.  Changes are consistent with early pancreatitis.  No pancreatic ductal dilatation.  Mild fatty infiltration of the pancreas.  Surgical absence of the gallbladder.  No bile duct dilatation. Liver, spleen, and adrenal glands are unremarkable.  Calcification of abdominal aorta without aneurysm.  Staghorn calculi in the lower pole right kidney without obstruction.  No hydronephrosis in either kidney.  Hypo attenuating lesion in the left kidney measures 11 mm and probably represents a cyst.  No retroperitoneal lymphadenopathy.  Scattered mesenteric and celiac axis lymph nodes are not enlarged and probably are reactive.  Small midline  abdominal wall hernia containing fat with scarring or fat necrosis. Prominent visceral adipose tissues.  No free fluid or free air in the abdomen.  The stomach, small bowel, and colon are decompressed.  Pelvis:  Uterus is surgically absent.  No abnormal adnexal masses. No bladder wall thickening.  No free or loculated pelvic fluid collections.  Calcifications in the pelvis consistent with phleboliths.  No diverticulitis.  The appendix is not identified. The bowel, spondylolysis and mild spondylolisthesis at L5-S1. Degenerative changes in the spine.  IMPRESSION: Inflammatory changes around the head of the pancreas consistent with early pancreatitis.  No evidence of abscess or fluid collection.  Small midline abdominal wall hernia superiorly with scarring or fat necrosis.  Staghorn type nonobstructing stone in the right kidney.  Original Report Authenticated By: Marlon Pel, M.D.   Dg Abd Acute W/chest  01/09/2012  *RADIOLOGY REPORT*  Clinical Data: Upper abdominal back pain and nausea.  ACUTE ABDOMEN SERIES (ABDOMEN 2 VIEW & CHEST 1 VIEW)  Comparison: 02/09/2009 chest x-ray  Findings: Shallow inspiration.  Linear fibrosis in the left lung base, stable.  Nodular opacity over the right mid lung is stable since the previous study and shown to represent a pleural fat collection on CT from 09/20/2009.  No focal airspace consolidation in the lungs.  No blunting of costophrenic angles.  No pneumothorax.  Mediastinal contours appear intact.  Normal heart size and pulmonary vascularity.  No significant change since previous chest radiograph.  Normal bowel gas pattern with scattered gas and stool in the colon. No small or large bowel distension.  Surgical clips in the right upper quadrant.  No free intra-abdominal air.  No abnormal air fluid levels.  Increased density in the right upper quadrant may represent renal stones.  This is stable since previous study. Degenerative changes in the spine and hips.  Calcified  phleboliths in the pelvis.  IMPRESSION: Stable appearance of the chest with chronic changes as described. No evidence of active pulmonary disease.  Nonobstructive bowel gas pattern.  Calcifications in the right abdomen may represent renal stones and are stable since previous study.  No free air.  Original Report Authenticated By: Marlon Pel, M.D.    Date: 01/09/2012  Rate: 90  Rhythm: normal sinus rhythm  QRS Axis: normal  Intervals: normal  ST/T Wave abnormalities: normal  Conduction Disutrbances:none  Narrative Interpretation: LVH  Old EKG Reviewed: unchanged   1. Pancreatitis       MDM  71 year old female with acute abdominal pain for last 24 hours. Differential includes aortic aneurysm rupture, gastric ulcer perforation, pancreatitis, gastritis, small bowel obstruction, mesenteric ischemia. Patient noted to have leukocytosis, and elevated lipase. No free air on acute abdominal series. CT scan with signs of early pancreatitis. We'll discuss with hospitalist for admission, pain control and slow advancement of diet        Olivia Mackie, MD 01/09/12 0454  Olivia Mackie, MD 01/09/12 [phone]

## 2012-01-10 LAB — URINE CULTURE: Culture: NO GROWTH

## 2012-01-10 LAB — COMPREHENSIVE METABOLIC PANEL
ALT: 9 U/L (ref 0–35)
AST: 13 U/L (ref 0–37)
Alkaline Phosphatase: 86 U/L (ref 39–117)
CO2: 24 mEq/L (ref 19–32)
GFR calc Af Amer: 64 mL/min — ABNORMAL LOW (ref >90)
Glucose, Bld: 102 mg/dL — ABNORMAL HIGH (ref 70–99)
Potassium: 4 mEq/L (ref 3.5–5.1)
Sodium: 135 mEq/L (ref 135–145)
Total Protein: 6.3 g/dL (ref 6.0–8.3)

## 2012-01-10 LAB — CBC
HCT: 33 % — ABNORMAL LOW (ref 36.0–46.0)
MCH: 27.4 pg (ref 26.0–34.0)
MCHC: 32.4 g/dL (ref 30.0–36.0)
RDW: 16.7 % — ABNORMAL HIGH (ref 11.5–15.5)

## 2012-01-10 MED ORDER — GI COCKTAIL ~~LOC~~
30.0000 mL | Freq: Once | ORAL | Status: AC
  Administered 2012-01-10: 30 mL via ORAL
  Filled 2012-01-10: qty 30

## 2012-01-10 MED ORDER — WHITE PETROLATUM GEL
Status: AC
  Administered 2012-01-10: 01:00:00
  Filled 2012-01-10: qty 5

## 2012-01-10 NOTE — Progress Notes (Signed)
[facility] PROGRESS NOTE  [name] DROHAN ZOX:[id number] DOB: [birth year] DOA: 01/09/2012 PCP: Sissy Hoff, MD  #1 acute pancreatitis.  -This is likely due to hydrochlorothiazide. The patient is status post cholecystectomy. -Overall "I'm feeling 50% better", but complains of worsen pain with minimal intake of clear liquids -make NPO for now, try GI cocktail  -8/8 CT of the abdomen and pelvis did not show any biliary ductal dilatation nor any other acute findings. #2 leukocytosis. This is likely reactive to the pancreatitis. Blood cultures neg @24hr , and urine cultures still in process -Recheck morning labs including BMP and CBC #3 hyperlipidemia-triglycerides 65 #4 hypertension.  -Patient refused Avapro yesterday. Today is the first day she took both of her anti-HTN     Procedures/Studies: Ct Abdomen Pelvis Wo Contrast  01/09/2012  *RADIOLOGY REPORT*  Clinical Data: Epigastric pain and back pain.  Burning sensation. Elevated lipase.  Elevated white cell count.  Allergy to IV contrast material is reported.  CT ABDOMEN AND PELVIS WITHOUT CONTRAST  Technique:  Multidetector CT imaging of the abdomen and pelvis was performed following the standard protocol without intravenous contrast.  Comparison: None.  Findings: The lung bases are clear.  Coronary artery calcification. Small esophageal hiatal hernia.  There is infiltration in the fat around the head of the pancreas with infiltration extending to the anterior pararenal spaces bilaterally.  Changes are consistent with early pancreatitis.  No pancreatic ductal dilatation.  Mild fatty infiltration of the pancreas.  Surgical absence of the gallbladder.  No bile duct dilatation. Liver, spleen, and adrenal glands are unremarkable.  Calcification of abdominal aorta without aneurysm.  Staghorn calculi in the lower pole right kidney without obstruction.  No hydronephrosis in either kidney.  Hypo attenuating lesion in the left kidney measures 11 mm  and probably represents a cyst.  No retroperitoneal lymphadenopathy.  Scattered mesenteric and celiac axis lymph nodes are not enlarged and probably are reactive.  Small midline abdominal wall hernia containing fat with scarring or fat necrosis. Prominent visceral adipose tissues.  No free fluid or free air in the abdomen.  The stomach, small bowel, and colon are decompressed.  Pelvis:  Uterus is surgically absent.  No abnormal adnexal masses. No bladder wall thickening.  No free or loculated pelvic fluid collections.  Calcifications in the pelvis consistent with phleboliths.  No diverticulitis.  The appendix is not identified. The bowel, spondylolysis and mild spondylolisthesis at L5-S1. Degenerative changes in the spine.  IMPRESSION: Inflammatory changes around the head of the pancreas consistent with early pancreatitis.  No evidence of abscess or fluid collection.  Small midline abdominal wall hernia superiorly with scarring or fat necrosis.  Staghorn type nonobstructing stone in the right kidney.  Original Report Authenticated By: Marlon Pel, M.D.   Dg Abd Acute W/chest  01/09/2012  *RADIOLOGY REPORT*  Clinical Data: Upper abdominal back pain and nausea.  ACUTE ABDOMEN SERIES (ABDOMEN 2 VIEW & CHEST 1 VIEW)  Comparison: 02/09/2009 chest x-ray  Findings: Shallow inspiration.  Linear fibrosis in the left lung base, stable.  Nodular opacity over the right mid lung is stable since the previous study and shown to represent a pleural fat collection on CT from 09/20/2009.  No focal airspace consolidation in the lungs.  No blunting of costophrenic angles.  No pneumothorax.  Mediastinal contours appear intact.  Normal heart size and pulmonary vascularity.  No significant change since previous chest radiograph.  Normal bowel gas pattern with scattered gas and stool in the colon. No small or large  bowel distension.  Surgical clips in the right upper quadrant.  No free intra-abdominal air.  No abnormal air fluid  levels.  Increased density in the right upper quadrant may represent renal stones.  This is stable since previous study. Degenerative changes in the spine and hips.  Calcified phleboliths in the pelvis.  IMPRESSION: Stable appearance of the chest with chronic changes as described. No evidence of active pulmonary disease.  Nonobstructive bowel gas pattern.  Calcifications in the right abdomen may represent renal stones and are stable since previous study.  No free air.  Original Report Authenticated By: Marlon Pel, M.D.      Code Status: Full Family Communication: Pt at bedside Disposition Plan: Home when medically stable  HPI/Subjective: No events overnight. Patient states "I feel 50% better". There is no vomiting but the patient continues to complain of nausea and abdominal pain with minimal intake of clear liquids. She denies fevers, chills, chest pain, shortness breath, diarrhea, dysuria, hematuria.  Objective: Filed Vitals:   01/09/12 1732 01/09/12 2152 01/10/12 0126 01/10/12 0528  BP: 165/53 152/69 168/57 160/51  Pulse: 91 89 87 88  Temp: 97.9 F (36.6 C) 98.1 F (36.7 C) 98.9 F (37.2 C) 97.8 F (36.6 C)  TempSrc: Oral Oral Oral Oral  Resp: 18 18 18 18   Height:      Weight:      SpO2: 94% 93% 93% 94%    Intake/Output Summary (Last 24 hours) at 01/10/12 1329 Last data filed at 01/10/12 0930  Gross per 24 hour  Intake   1024 ml  Output    875 ml  Net    149 ml    Exam:   General:  Pt is alert, follows commands appropriately, not in acute distress  HEENT: No icterus, no thrush, [place]/AT  Cardiovascular: Regular rate and rhythm, S1/S2, no murmurs, no rubs, no gallops  Respiratory: Clear to auscultation bilaterally, no wheezing, no crackles, no rhonchi  Abdomen: Soft,  non distended, bowel sounds present, minimal epigastric tenderness on palpation no guarding  Extremities: No edema, pulses DP and PT palpable bilaterally  Neuro: Grossly nonfocal  Data  Reviewed: Basic Metabolic Panel:  Lab 01/10/12 8657 01/09/12 1342 01/09/12 0443  NA 135 -- 133*  K 4.0 -- 3.8  CL 100 -- 96  CO2 24 -- 22  GLUCOSE 102* -- 132*  BUN 16 -- 26*  CREATININE 0.99 0.91 1.02  CALCIUM 8.4 -- 9.5  MG -- -- --  PHOS -- -- --   Liver Function Tests:  Lab 01/10/12 0535 01/09/12 0443  AST 13 13  ALT 9 11  ALKPHOS 86 81  BILITOT 0.3 0.3  PROT 6.3 7.4  ALBUMIN 2.5* 3.4*    Lab 01/09/12 0443  LIPASE 201*  AMYLASE --   No results found for this basename: AMMONIA:5 in the last 168 hours CBC:  Lab 01/10/12 0535 01/09/12 1342 01/09/12 0443  WBC 17.9* 19.8* 18.6*  NEUTROABS -- -- 16.0*  HGB 10.7* 11.7* 11.8*  HCT 33.0* 35.6* 35.7*  MCV 84.4 84.2 84.0  PLT 218 184 238   Cardiac Enzymes: No results found for this basename: CKTOTAL:5,CKMB:5,CKMBINDEX:5,TROPONINI:5 in the last 168 hours BNP: No components found with this basename: POCBNP:5 CBG: No results found for this basename: GLUCAP:5 in the last 168 hours  Recent Results (from the past 240 hour(s))  CULTURE, BLOOD (ROUTINE X 2)     Status: Normal (Preliminary result)   Collection Time   01/09/12 10:00 AM  Component Value Range Status Comment   Specimen Description BLOOD RIGHT ARM   Final    Special Requests BOTTLES DRAWN AEROBIC AND ANAEROBIC 10CC   Final    Culture  Setup Time 01/09/2012 12:41   Final    Culture     Final    Value:        BLOOD CULTURE RECEIVED NO GROWTH TO DATE CULTURE WILL BE HELD FOR 5 DAYS BEFORE ISSUING A FINAL NEGATIVE REPORT   Report Status PENDING   Incomplete   CULTURE, BLOOD (ROUTINE X 2)     Status: Normal (Preliminary result)   Collection Time   01/09/12  1:15 PM      Component Value Range Status Comment   Specimen Description BLOOD RIGHT HAND   Final    Special Requests BOTTLES DRAWN AEROBIC ONLY 3CC   Final    Culture  Setup Time 01/09/2012 21:45   Final    Culture     Final    Value:        BLOOD CULTURE RECEIVED NO GROWTH TO DATE CULTURE WILL BE HELD  FOR 5 DAYS BEFORE ISSUING A FINAL NEGATIVE REPORT   Report Status PENDING   Incomplete      Scheduled Meds:    . amLODipine  5 mg Oral Daily  . enoxaparin (LOVENOX) injection  40 mg Subcutaneous Q24H  . gi cocktail  30 mL Oral Once  . irbesartan  150 mg Oral Daily  . pantoprazole (PROTONIX) IV  40 mg Intravenous Q24H  . pneumococcal 23 valent vaccine  0.5 mL Intramuscular Once  . simvastatin  20 mg Oral q1800  . white petrolatum       Continuous Infusions:    . sodium chloride 100 mL/hr at 01/10/12 1143     Dwanda Tufano, MD  [facility] Pager [phone]  If 7PM-7AM, please contact night-coverage [email or web address] Password [email or web address] 01/10/2012, 1:29 PM   LOS: 1 day

## 2012-01-10 NOTE — Progress Notes (Signed)
Pt's bp 168/57,c/o abdominal pain,IV site infiltrated,unable to restart due to poor veins,IV RN paged,will continue to monitor.

## 2012-01-11 ENCOUNTER — Inpatient Hospital Stay (HOSPITAL_COMMUNITY): Payer: Medicare Other

## 2012-01-11 DIAGNOSIS — R109 Unspecified abdominal pain: Secondary | ICD-10-CM

## 2012-01-11 LAB — CBC
MCH: 26.8 pg (ref 26.0–34.0)
MCHC: 31.1 g/dL (ref 30.0–36.0)
MCV: 86.2 fL (ref 78.0–100.0)
Platelets: 233 10*3/uL (ref 150–400)
RBC: 3.69 MIL/uL — ABNORMAL LOW (ref 3.87–5.11)
RDW: 16.7 % — ABNORMAL HIGH (ref 11.5–15.5)

## 2012-01-11 LAB — BASIC METABOLIC PANEL
Calcium: 8.3 mg/dL — ABNORMAL LOW (ref 8.4–10.5)
Creatinine, Ser: 1.18 mg/dL — ABNORMAL HIGH (ref 0.50–1.10)
GFR calc Af Amer: 52 mL/min — ABNORMAL LOW (ref >90)
GFR calc non Af Amer: 45 mL/min — ABNORMAL LOW (ref >90)
Sodium: 138 mEq/L (ref 135–145)

## 2012-01-11 MED ORDER — SENNA 8.6 MG PO TABS
1.0000 | ORAL_TABLET | Freq: Two times a day (BID) | ORAL | Status: DC
  Administered 2012-01-11 – 2012-01-12 (×3): 8.6 mg via ORAL
  Filled 2012-01-11 (×4): qty 1

## 2012-01-11 MED ORDER — BIOTENE DRY MOUTH MT LIQD
15.0000 mL | Freq: Two times a day (BID) | OROMUCOSAL | Status: DC
  Administered 2012-01-11 – 2012-01-12 (×2): 15 mL via OROMUCOSAL

## 2012-01-11 MED ORDER — CHLORHEXIDINE GLUCONATE 0.12 % MT SOLN
15.0000 mL | Freq: Two times a day (BID) | OROMUCOSAL | Status: DC
  Administered 2012-01-11: 15 mL via OROMUCOSAL
  Filled 2012-01-11: qty 15

## 2012-01-11 MED ORDER — SENNA 8.6 MG PO TABS: 1.0000 | ORAL_TABLET | Freq: Every day | ORAL | Status: DC

## 2012-01-11 MED ORDER — SIMETHICONE 80 MG PO CHEW
80.0000 mg | CHEWABLE_TABLET | Freq: Three times a day (TID) | ORAL | Status: DC
  Administered 2012-01-11 – 2012-01-13 (×7): 80 mg via ORAL
  Filled 2012-01-11 (×9): qty 1

## 2012-01-11 MED ORDER — DOCUSATE SODIUM 100 MG PO CAPS
100.0000 mg | ORAL_CAPSULE | Freq: Two times a day (BID) | ORAL | Status: DC
  Administered 2012-01-11 – 2012-01-13 (×5): 100 mg via ORAL
  Filled 2012-01-11 (×4): qty 1

## 2012-01-11 NOTE — Progress Notes (Signed)
[facility] PROGRESS NOTE  KEMIYA BATDORF ZOX:[id number] DOB: [birth year] DOA: 01/09/2012 PCP: Sissy Hoff, MD  Assessment/Plan: #1 acute pancreatitis.  -This is likely due to hydrochlorothiazide. The patient is status post cholecystectomy.  -Overall "I'm feeling 50% better", but complains of worsen pain with minimal intake of clear liquids  -restart clears -8/8 CT of the abdomen and pelvis did not show any biliary ductal dilatation nor any other acute findings.  -discussed with daughter at bedside who agrees with plan -repeat abd series given continued abdominal pain -PT eval -am cbc, bmp -start cathartics #2 leukocytosis.  -improving -cultures negative to date -no fevers; hemodynamically stable #3 hyperlipidemia-triglycerides 65  #4 hypertension.  -stable and improving on dual regimen of avapro and amlodipine   Principal Problem:  *Acute pancreatitis Active Problems:  Leukocytosis   Procedures/Studies: Ct Abdomen Pelvis Wo Contrast  01/09/2012  *RADIOLOGY REPORT*  Clinical Data: Epigastric pain and back pain.  Burning sensation. Elevated lipase.  Elevated white cell count.  Allergy to IV contrast material is reported.  CT ABDOMEN AND PELVIS WITHOUT CONTRAST  Technique:  Multidetector CT imaging of the abdomen and pelvis was performed following the standard protocol without intravenous contrast.  Comparison: None.  Findings: The lung bases are clear.  Coronary artery calcification. Small esophageal hiatal hernia.  There is infiltration in the fat around the head of the pancreas with infiltration extending to the anterior pararenal spaces bilaterally.  Changes are consistent with early pancreatitis.  No pancreatic ductal dilatation.  Mild fatty infiltration of the pancreas.  Surgical absence of the gallbladder.  No bile duct dilatation. Liver, spleen, and adrenal glands are unremarkable.  Calcification of abdominal aorta without aneurysm.  Staghorn calculi in the lower pole  right kidney without obstruction.  No hydronephrosis in either kidney.  Hypo attenuating lesion in the left kidney measures 11 mm and probably represents a cyst.  No retroperitoneal lymphadenopathy.  Scattered mesenteric and celiac axis lymph nodes are not enlarged and probably are reactive.  Small midline abdominal wall hernia containing fat with scarring or fat necrosis. Prominent visceral adipose tissues.  No free fluid or free air in the abdomen.  The stomach, small bowel, and colon are decompressed.  Pelvis:  Uterus is surgically absent.  No abnormal adnexal masses. No bladder wall thickening.  No free or loculated pelvic fluid collections.  Calcifications in the pelvis consistent with phleboliths.  No diverticulitis.  The appendix is not identified. The bowel, spondylolysis and mild spondylolisthesis at L5-S1. Degenerative changes in the spine.  IMPRESSION: Inflammatory changes around the head of the pancreas consistent with early pancreatitis.  No evidence of abscess or fluid collection.  Small midline abdominal wall hernia superiorly with scarring or fat necrosis.  Staghorn type nonobstructing stone in the right kidney.  Original Report Authenticated By: Marlon Pel, M.D.   Mr Knee Left  Wo Contrast  01/03/2012  *RADIOLOGY REPORT*  Clinical Data:  Left knee pain and swelling.  MRI OF THE LEFT KNEE WITHOUT CONTRAST  Technique:  Multiplanar, multisequence MR imaging of the left knee was performed.  No intravenous contrast was administered.  Comparison:  None  FINDINGS: MENISCI Medial:  A small mid body and posterior horn tears are present. The anterior horn is intact. Lateral:  Discoid morphology.  No meniscal tear.  LIGAMENTS Cruciates:  Intact. Collaterals:  Intact.  CARTILAGE Patellofemoral:  Advanced degenerative chondrosis/chondromalacia. Medial:  Moderate to advanced degenerative chondrosis.  Small focal areas of suspected full-thickness cartilage loss. Lateral:  Moderate degenerative  chondrosis.  Joint:  Small joint effusion. Popliteal Fossa:  No Baker's cyst. Extensor Mechanism:  Patella retinacular structures are intact. The quadriceps and patellar tendons are intact. Bones: No acute bony findings.  There is a small lesion in the lateral aspect of the lateral tibia near the tibiofibular joint. This could be a small bone infarct, benign bone cyst or intraosseous ganglion.  IMPRESSION:  1.  Tricompartmental degenerative change. 2.  Midbody and posterior horn medial meniscus tears. 3.  Intact ligamentous structures and no acute bony findings. 4.  Small joint effusion.  Original Report Authenticated By: P. Loralie Champagne, M.D.   Dg Abd Acute W/chest  01/09/2012  *RADIOLOGY REPORT*  Clinical Data: Upper abdominal back pain and nausea.  ACUTE ABDOMEN SERIES (ABDOMEN 2 VIEW & CHEST 1 VIEW)  Comparison: 02/09/2009 chest x-ray  Findings: Shallow inspiration.  Linear fibrosis in the left lung base, stable.  Nodular opacity over the right mid lung is stable since the previous study and shown to represent a pleural fat collection on CT from 09/20/2009.  No focal airspace consolidation in the lungs.  No blunting of costophrenic angles.  No pneumothorax.  Mediastinal contours appear intact.  Normal heart size and pulmonary vascularity.  No significant change since previous chest radiograph.  Normal bowel gas pattern with scattered gas and stool in the colon. No small or large bowel distension.  Surgical clips in the right upper quadrant.  No free intra-abdominal air.  No abnormal air fluid levels.  Increased density in the right upper quadrant may represent renal stones.  This is stable since previous study. Degenerative changes in the spine and hips.  Calcified phleboliths in the pelvis.  IMPRESSION: Stable appearance of the chest with chronic changes as described. No evidence of active pulmonary disease.  Nonobstructive bowel gas pattern.  Calcifications in the right abdomen may represent renal stones  and are stable since previous study.  No free air.  Original Report Authenticated By: Marlon Pel, M.D.      Code Status: Full Family Communication: Pt at bedside Disposition Plan: Home when medically stable  Subjective: Pt continues to complain of abd pain, but still better than at the time of admission.  Denies f/c, vomit, diarrhea, headache, cp, sob, rashes.  C/o some constipation.  Objective: Filed Vitals:   01/10/12 1346 01/10/12 1400 01/10/12 2229 01/11/12 0605  BP: 112/38 123/50 141/52 136/55  Pulse: 83  80 81  Temp: 98.7 F (37.1 C)  98.2 F (36.8 C) 97.7 F (36.5 C)  TempSrc: Oral  Oral Oral  Resp: 18  18 18   Height:      Weight:      SpO2: 93%  95% 97%    Intake/Output Summary (Last 24 hours) at 01/11/12 1110 Last data filed at 01/11/12 0900  Gross per 24 hour  Intake   2072 ml  Output   1350 ml  Net    722 ml   Weight change:  Exam:   General:  Pt is alert, follows commands appropriately, not in acute distress  HEENT: No icterus, No thrush, No neck mass, [place]/AT  Cardiovascular: Regular rate and rhythm, S1/S2, , no rubs, no gallops  Respiratory: Clear to auscultation bilaterally, no wheezing, no crackles, no rhonchi  Abdomen: Soft,non distended, bowel sounds present, no guarding. Mild epigastric tenderness to palpation.  Extremities:  Trace edema legs, pulses DP and PT palpable bilaterally. Scattered ecchymosis on arms.  Data Reviewed: Basic Metabolic Panel:  Lab 01/11/12 1610 01/10/12 0535 01/09/12 1342  01/09/12 0443  NA 138 135 -- 133*  K 3.8 4.0 -- 3.8  CL 102 100 -- 96  CO2 28 24 -- 22  GLUCOSE 100* 102* -- 132*  BUN 17 16 -- 26*  CREATININE 1.18* 0.99 0.91 1.02  CALCIUM 8.3* 8.4 -- 9.5  MG -- -- -- --  PHOS -- -- -- --   Liver Function Tests:  Lab 01/10/12 0535 01/09/12 0443  AST 13 13  ALT 9 11  ALKPHOS 86 81  BILITOT 0.3 0.3  PROT 6.3 7.4  ALBUMIN 2.5* 3.4*    Lab 01/09/12 0443  LIPASE 201*  AMYLASE --   No  results found for this basename: AMMONIA:5 in the last 168 hours CBC:  Lab 01/11/12 0555 01/10/12 0535 01/09/12 1342 01/09/12 0443  WBC 11.2* 17.9* 19.8* 18.6*  NEUTROABS -- -- -- 16.0*  HGB 9.9* 10.7* 11.7* 11.8*  HCT 31.8* 33.0* 35.6* 35.7*  MCV 86.2 84.4 84.2 84.0  PLT 233 218 184 238   Cardiac Enzymes: No results found for this basename: CKTOTAL:5,CKMB:5,CKMBINDEX:5,TROPONINI:5 in the last 168 hours BNP: No components found with this basename: POCBNP:5 CBG: No results found for this basename: GLUCAP:5 in the last 168 hours  Recent Results (from the past 240 hour(s))  CULTURE, BLOOD (ROUTINE X 2)     Status: Normal (Preliminary result)   Collection Time   01/09/12 10:00 AM      Component Value Range Status Comment   Specimen Description BLOOD RIGHT ARM   Final    Special Requests BOTTLES DRAWN AEROBIC AND ANAEROBIC 10CC   Final    Culture  Setup Time 01/09/2012 12:41   Final    Culture     Final    Value:        BLOOD CULTURE RECEIVED NO GROWTH TO DATE CULTURE WILL BE HELD FOR 5 DAYS BEFORE ISSUING A FINAL NEGATIVE REPORT   Report Status PENDING   Incomplete   CULTURE, BLOOD (ROUTINE X 2)     Status: Normal (Preliminary result)   Collection Time   01/09/12  1:15 PM      Component Value Range Status Comment   Specimen Description BLOOD RIGHT HAND   Final    Special Requests BOTTLES DRAWN AEROBIC ONLY 3CC   Final    Culture  Setup Time 01/09/2012 21:45   Final    Culture     Final    Value:        BLOOD CULTURE RECEIVED NO GROWTH TO DATE CULTURE WILL BE HELD FOR 5 DAYS BEFORE ISSUING A FINAL NEGATIVE REPORT   Report Status PENDING   Incomplete   URINE CULTURE     Status: Normal   Collection Time   01/09/12  2:15 PM      Component Value Range Status Comment   Specimen Description URINE, CLEAN CATCH   Final    Special Requests NONE   Final    Culture  Setup Time 01/09/2012 14:48   Final    Colony Count NO GROWTH   Final    Culture NO GROWTH   Final    Report Status 01/10/2012  FINAL   Final      Scheduled Meds:   . amLODipine  5 mg Oral Daily  . antiseptic oral rinse  15 mL Mouth Rinse q12n4p  . chlorhexidine  15 mL Mouth Rinse BID  . docusate sodium  100 mg Oral BID  . enoxaparin (LOVENOX) injection  40 mg Subcutaneous Q24H  . gi  cocktail  30 mL Oral Once  . irbesartan  150 mg Oral Daily  . pantoprazole (PROTONIX) IV  40 mg Intravenous Q24H  . senna  1 tablet Oral BID  . simethicone  80 mg Oral TID  . simvastatin  20 mg Oral q1800  . DISCONTD: senna  1 tablet Oral Daily   Continuous Infusions:   . sodium chloride 100 mL/hr at 01/11/12 0554     Marisa Hufstetler, DO  [facility] Pager [phone]  If 7PM-7AM, please contact night-coverage [email or web address] Password [email or web address] 01/11/2012, 11:10 AM   LOS: 2 days

## 2012-01-12 ENCOUNTER — Inpatient Hospital Stay (HOSPITAL_COMMUNITY): Payer: Medicare Other

## 2012-01-12 DIAGNOSIS — R930 Abnormal findings on diagnostic imaging of skull and head, not elsewhere classified: Secondary | ICD-10-CM

## 2012-01-12 LAB — CBC
Hemoglobin: 9.4 g/dL — ABNORMAL LOW (ref 12.0–15.0)
MCH: 27.7 pg (ref 26.0–34.0)
Platelets: 229 10*3/uL (ref 150–400)
RBC: 3.39 MIL/uL — ABNORMAL LOW (ref 3.87–5.11)

## 2012-01-12 LAB — BASIC METABOLIC PANEL
Calcium: 8.2 mg/dL — ABNORMAL LOW (ref 8.4–10.5)
GFR calc Af Amer: 64 mL/min — ABNORMAL LOW (ref >90)
GFR calc non Af Amer: 56 mL/min — ABNORMAL LOW (ref >90)
Potassium: 3.8 mEq/L (ref 3.5–5.1)
Sodium: 135 mEq/L (ref 135–145)

## 2012-01-12 LAB — LIPASE, BLOOD: Lipase: 16 U/L (ref 11–59)

## 2012-01-12 MED ORDER — POLYETHYLENE GLYCOL 3350 17 G PO PACK
17.0000 g | PACK | Freq: Every day | ORAL | Status: DC
  Administered 2012-01-12 – 2012-01-13 (×2): 17 g via ORAL
  Filled 2012-01-12 (×2): qty 1

## 2012-01-12 MED ORDER — SENNA 8.6 MG PO TABS
2.0000 | ORAL_TABLET | Freq: Two times a day (BID) | ORAL | Status: DC
  Administered 2012-01-12 – 2012-01-13 (×2): 17.2 mg via ORAL
  Filled 2012-01-12 (×2): qty 2
  Filled 2012-01-12: qty 1

## 2012-01-12 NOTE — Evaluation (Signed)
Physical Therapy Evaluation Patient Details Name: [name] [name] MRN: [id number] DOB: [birth year] Today's Date: 01/12/2012 Time: 7829-5621 PT Time Calculation (min): 17 min  PT Assessment / Plan / Recommendation Clinical Impression  Pt is a 71 y/o female admitted with pancreatitis.  Pt has been mobilizing with a torn left meniscus for the last several months.  Pt is still modified independent with all mobility.  No further PT needs identified acutely or for d/c.  Will sign off.  Thanks!    PT Assessment  Patent does not need any further PT services    Follow Up Recommendations  No PT follow up    Barriers to Discharge        Equipment Recommendations  None recommended by PT    Recommendations for Other Services     Frequency      Precautions / Restrictions Precautions Precautions: None Restrictions Weight Bearing Restrictions: No   Pertinent Vitals/Pain None      Mobility  Bed Mobility Bed Mobility: Supine to Sit Supine to Sit: 6: Modified independent (Device/Increase time);HOB flat Transfers Transfers: Sit to Stand;Stand to Sit (2 trials.) Sit to Stand: 6: Modified independent (Device/Increase time);With upper extremity assist;From bed;From chair/3-in-1 Stand to Sit: 6: Modified independent (Device/Increase time);With upper extremity assist;To chair/3-in-1 Ambulation/Gait Ambulation/Gait Assistance: 6: Modified independent (Device/Increase time) Ambulation Distance (Feet): 300 Feet Assistive device: Other (Comment) (Pushing IV pole.) Ambulation/Gait Assistance Details: D/W patient use of single point cane at home.  Pt reports her cane is too tall, and questioned this PT about how tall it should be.  Educated pt on proper fit of single point cane. Gait Pattern: Antalgic (Premorbid due to meniscal tear. Balance not affected.) Stairs: No Wheelchair Mobility Wheelchair Mobility: No    Exercises     PT Diagnosis:    PT Problem List:   PT Treatment Interventions:       PT Goals    Visit Information  Last PT Received On: 01/12/12 Assistance Needed: +1    Subjective Data  Subjective: "I ate breakfast, and then I fell apart." Patient Stated Goal: Go home.   Prior Functioning  Home Living Lives With: Alone Available Help at Discharge: Available PRN/intermittently;Family Type of Home: Mobile home Home Access: Ramped entrance (Building a ramp to enter.) Home Layout: One level Home Adaptive Equipment: Straight cane Prior Function Level of Independence: Independent Able to Take Stairs?: Yes Driving: Yes Vocation: Retired Musician: No difficulties    Cognition  Overall Cognitive Status: Appears within functional limits for tasks assessed/performed Arousal/Alertness: Awake/alert Orientation Level: Appears intact for tasks assessed Behavior During Session: [facility] for tasks performed    Extremity/Trunk Assessment Right Upper Extremity Assessment RUE ROM/Strength/Tone: Within functional levels RUE Sensation: WFL - Light Touch Left Upper Extremity Assessment LUE ROM/Strength/Tone: Within functional levels LUE Sensation: WFL - Light Touch LUE Coordination: WFL - gross/fine motor Right Lower Extremity Assessment RLE ROM/Strength/Tone: Within functional levels RLE Sensation: WFL - Light Touch RLE Coordination: WFL - gross/fine motor Left Lower Extremity Assessment LLE ROM/Strength/Tone: WFL for tasks assessed (Left prior knee meniscal tear, which is going to be fixed.) LLE Sensation: WFL - Light Touch LLE Coordination: WFL - gross/fine motor Trunk Assessment Trunk Assessment: Normal   Balance Balance Balance Assessed: No  End of Session PT - End of Session Activity Tolerance: Patient tolerated treatment well Patient left: in chair;with call bell/phone within reach Nurse Communication: Mobility status  GP     Cephus Shelling 01/12/2012, 12:50 PM  01/12/2012 Cephus Shelling, PT,  DPT [phone]

## 2012-01-12 NOTE — Progress Notes (Signed)
[facility] PROGRESS NOTE  [name] [name] JXB:[id number] DOB: [birth year] DOA: 01/09/2012 PCP: Sissy Hoff, MD  Assessment/Plan:  #1 acute pancreatitis.  -This is likely due to hydrochlorothiazide. The patient is status post cholecystectomy.  -Patient is able to tolerate approximately half of her clear liquid diet -Clinically she appears to be improving but continues to complain of abdominal pain with oral intake -8/8 CT of the abdomen and pelvis did not show any biliary ductal dilatation nor any other acute findings.  -discussed with daughter  -Continue cathartics, increase Senokot dose -Advanced a cardiac diet #2 leukocytosis.  -improved -cultures negative to date  -no fevers; hemodynamically stable  Lung nodule-right middle lobe -The patient has had a history of a right lung nodule in the past. CT chest on April 2011 showed resolution of previous right lower lobe airspace disease with residual basilar 2 mm nodular opacities unchanged -Repeat CT chest, patient has reported iodine allergy Adynamic ileus -Although report on abdominal x-ray yesterday, the patient has clinically improved -There is no vomiting, patient is able to pass gas -Abdominal pain is better than yesterday hyperlipidemia-triglycerides 65  hypertension.  -stable and improving on dual regimen of avapro and amlodipine    Procedures/Studies: Ct Abdomen Pelvis Wo Contrast  01/09/2012  *RADIOLOGY REPORT*  Clinical Data: Epigastric pain and back pain.  Burning sensation. Elevated lipase.  Elevated white cell count.  Allergy to IV contrast material is reported.  CT ABDOMEN AND PELVIS WITHOUT CONTRAST  Technique:  Multidetector CT imaging of the abdomen and pelvis was performed following the standard protocol without intravenous contrast.  Comparison: None.  Findings: The lung bases are clear.  Coronary artery calcification. Small esophageal hiatal hernia.  There is infiltration in the fat around the head of the  pancreas with infiltration extending to the anterior pararenal spaces bilaterally.  Changes are consistent with early pancreatitis.  No pancreatic ductal dilatation.  Mild fatty infiltration of the pancreas.  Surgical absence of the gallbladder.  No bile duct dilatation. Liver, spleen, and adrenal glands are unremarkable.  Calcification of abdominal aorta without aneurysm.  Staghorn calculi in the lower pole right kidney without obstruction.  No hydronephrosis in either kidney.  Hypo attenuating lesion in the left kidney measures 11 mm and probably represents a cyst.  No retroperitoneal lymphadenopathy.  Scattered mesenteric and celiac axis lymph nodes are not enlarged and probably are reactive.  Small midline abdominal wall hernia containing fat with scarring or fat necrosis. Prominent visceral adipose tissues.  No free fluid or free air in the abdomen.  The stomach, small bowel, and colon are decompressed.  Pelvis:  Uterus is surgically absent.  No abnormal adnexal masses. No bladder wall thickening.  No free or loculated pelvic fluid collections.  Calcifications in the pelvis consistent with phleboliths.  No diverticulitis.  The appendix is not identified. The bowel, spondylolysis and mild spondylolisthesis at L5-S1. Degenerative changes in the spine.  IMPRESSION: Inflammatory changes around the head of the pancreas consistent with early pancreatitis.  No evidence of abscess or fluid collection.  Small midline abdominal wall hernia superiorly with scarring or fat necrosis.  Staghorn type nonobstructing stone in the right kidney.  Original Report Authenticated By: Marlon Pel, M.D.   Mr Knee Left  Wo Contrast  01/03/2012  *RADIOLOGY REPORT*  Clinical Data:  Left knee pain and swelling.  MRI OF THE LEFT KNEE WITHOUT CONTRAST  Technique:  Multiplanar, multisequence MR imaging of the left knee was performed.  No intravenous contrast was administered.  Comparison:  None  FINDINGS: MENISCI Medial:  A small mid  body and posterior horn tears are present. The anterior horn is intact. Lateral:  Discoid morphology.  No meniscal tear.  LIGAMENTS Cruciates:  Intact. Collaterals:  Intact.  CARTILAGE Patellofemoral:  Advanced degenerative chondrosis/chondromalacia. Medial:  Moderate to advanced degenerative chondrosis.  Small focal areas of suspected full-thickness cartilage loss. Lateral:  Moderate degenerative chondrosis.  Joint:  Small joint effusion. Popliteal Fossa:  No Baker's cyst. Extensor Mechanism:  Patella retinacular structures are intact. The quadriceps and patellar tendons are intact. Bones: No acute bony findings.  There is a small lesion in the lateral aspect of the lateral tibia near the tibiofibular joint. This could be a small bone infarct, benign bone cyst or intraosseous ganglion.  IMPRESSION:  1.  Tricompartmental degenerative change. 2.  Midbody and posterior horn medial meniscus tears. 3.  Intact ligamentous structures and no acute bony findings. 4.  Small joint effusion.  Original Report Authenticated By: P. Loralie Champagne, M.D.   Dg Abd Acute W/chest  01/11/2012  *RADIOLOGY REPORT*  Clinical Data: Generalized abdominal pain  ACUTE ABDOMEN SERIES (ABDOMEN 2 VIEW & CHEST 1 VIEW)  Comparison: Chest radiograph - 02/09/2009; chest CT - 09/20/2009; CT abdomen pelvis - 01/09/2012; abdominal radiograph - 01/09/2012  Findings:  Grossly unchanged enlarged cardiac silhouette and mediastinal contours.  There is persistent mild elevation of the left hemidiaphragm with left basilar linear heterogeneous opacities. Previously characterize fat density nodule overlying the lateral right mid lung is unchanged.  No new or airspace opacities.  No definite pleural effusion or pneumothorax.  There is mild to moderate gaseous distension of multiple loops of large and small bowel.  No definite pneumoperitoneum, pneumatosis or portal venous gas. Opacity overlying the right mid hemiabdomen compatible with nonobstructing staghorn  calculus seen on prior abdominal CT.  Post cholecystectomy.  Thoracolumbar spine degenerative change.  Degenerative change of the pubic symphysis.  IMPRESSION: 1.  No acute cardiopulmonary disease.  Grossly unchanged previously characterized fat density nodule overlying the peripheral aspect of the right mid lung.  2.  Mild to moderate gaseous distension of multiple loops of large and small bowel suggestive of ileus.  3. Right-sided staghorn calculus.  Original Report Authenticated By: Waynard Reeds, M.D.   Dg Abd Acute W/chest  01/09/2012  *RADIOLOGY REPORT*  Clinical Data: Upper abdominal back pain and nausea.  ACUTE ABDOMEN SERIES (ABDOMEN 2 VIEW & CHEST 1 VIEW)  Comparison: 02/09/2009 chest x-ray  Findings: Shallow inspiration.  Linear fibrosis in the left lung base, stable.  Nodular opacity over the right mid lung is stable since the previous study and shown to represent a pleural fat collection on CT from 09/20/2009.  No focal airspace consolidation in the lungs.  No blunting of costophrenic angles.  No pneumothorax.  Mediastinal contours appear intact.  Normal heart size and pulmonary vascularity.  No significant change since previous chest radiograph.  Normal bowel gas pattern with scattered gas and stool in the colon. No small or large bowel distension.  Surgical clips in the right upper quadrant.  No free intra-abdominal air.  No abnormal air fluid levels.  Increased density in the right upper quadrant may represent renal stones.  This is stable since previous study. Degenerative changes in the spine and hips.  Calcified phleboliths in the pelvis.  IMPRESSION: Stable appearance of the chest with chronic changes as described. No evidence of active pulmonary disease.  Nonobstructive bowel gas pattern.  Calcifications in the right abdomen may  represent renal stones and are stable since previous study.  No free air.  Original Report Authenticated By: Marlon Pel, M.D.    Code Status: Full Family  Communication: Pt at bedside Disposition Plan: Home when medically stable  Subjective: Patient continues to complain of some epigastric pain with food intake. She is able to tolerate approximately half of her meal trays. In fact, she has required less opioid use. She denies any fevers, chills, chest pain, shortness of breath, vomiting, diarrhea. Still no bowel movement.  Objective: Filed Vitals:   01/11/12 1514 01/11/12 2224 01/12/12 0627 01/12/12 1413  BP: 128/54 147/52 148/80 127/55  Pulse: 80 78 85 72  Temp: 98.3 F (36.8 C) 98.4 F (36.9 C) 98.2 F (36.8 C) 98.1 F (36.7 C)  TempSrc: Oral Oral  Oral  Resp: 18 18 19 18   Height:      Weight:      SpO2: 98% 93% 94% 92%    Intake/Output Summary (Last 24 hours) at 01/12/12 1522 Last data filed at 01/12/12 1414  Gross per 24 hour  Intake   2359 ml  Output    600 ml  Net   1759 ml   Weight change:  Exam:   General:  Pt is alert, follows commands appropriately, not in acute distress  HEENT: No icterus, No thrush, No neck mass, [place]/AT  Cardiovascular: Regular rate and rhythm, S1/S2, , no rubs, no gallops  Respiratory: Clear to auscultation bilaterally, no wheezing, no crackles, no rhonchi  Abdomen: Soft,  non distended, bowel sounds present, no guarding. Mild epigastric tenderness without any guarding. No peritoneal signs  Extremities: Trace bilateral lower extremity edema, pulses DP and PT palpable bilaterally  Data Reviewed: Basic Metabolic Panel:  Lab 01/12/12 8295 01/11/12 0555 01/10/12 0535 01/09/12 1342 01/09/12 0443  NA 135 138 135 -- 133*  K 3.8 3.8 4.0 -- 3.8  CL 102 102 100 -- 96  CO2 24 28 24  -- 22  GLUCOSE 85 100* 102* -- 132*  BUN 14 17 16  -- 26*  CREATININE 0.99 1.18* 0.99 0.91 1.02  CALCIUM 8.2* 8.3* 8.4 -- 9.5  MG -- -- -- -- --  PHOS -- -- -- -- --   Liver Function Tests:  Lab 01/10/12 0535 01/09/12 0443  AST 13 13  ALT 9 11  ALKPHOS 86 81  BILITOT 0.3 0.3  PROT 6.3 7.4  ALBUMIN 2.5*  3.4*    Lab 01/12/12 0615 01/09/12 0443  LIPASE 16 201*  AMYLASE -- --   No results found for this basename: AMMONIA:5 in the last 168 hours CBC:  Lab 01/12/12 0615 01/11/12 0555 01/10/12 0535 01/09/12 1342 01/09/12 0443  WBC 8.2 11.2* 17.9* 19.8* 18.6*  NEUTROABS -- -- -- -- 16.0*  HGB 9.4* 9.9* 10.7* 11.7* 11.8*  HCT 29.2* 31.8* 33.0* 35.6* 35.7*  MCV 86.1 86.2 84.4 84.2 84.0  PLT 229 233 218 184 238   Cardiac Enzymes: No results found for this basename: CKTOTAL:5,CKMB:5,CKMBINDEX:5,TROPONINI:5 in the last 168 hours BNP: No components found with this basename: POCBNP:5 CBG: No results found for this basename: GLUCAP:5 in the last 168 hours  Recent Results (from the past 240 hour(s))  CULTURE, BLOOD (ROUTINE X 2)     Status: Normal (Preliminary result)   Collection Time   01/09/12 10:00 AM      Component Value Range Status Comment   Specimen Description BLOOD RIGHT ARM   Final    Special Requests BOTTLES DRAWN AEROBIC AND ANAEROBIC 10CC  Final    Culture  Setup Time 01/09/2012 12:41   Final    Culture     Final    Value:        BLOOD CULTURE RECEIVED NO GROWTH TO DATE CULTURE WILL BE HELD FOR 5 DAYS BEFORE ISSUING A FINAL NEGATIVE REPORT   Report Status PENDING   Incomplete   CULTURE, BLOOD (ROUTINE X 2)     Status: Normal (Preliminary result)   Collection Time   01/09/12  1:15 PM      Component Value Range Status Comment   Specimen Description BLOOD RIGHT HAND   Final    Special Requests BOTTLES DRAWN AEROBIC ONLY 3CC   Final    Culture  Setup Time 01/09/2012 21:45   Final    Culture     Final    Value:        BLOOD CULTURE RECEIVED NO GROWTH TO DATE CULTURE WILL BE HELD FOR 5 DAYS BEFORE ISSUING A FINAL NEGATIVE REPORT   Report Status PENDING   Incomplete   URINE CULTURE     Status: Normal   Collection Time   01/09/12  2:15 PM      Component Value Range Status Comment   Specimen Description URINE, CLEAN CATCH   Final    Special Requests NONE   Final    Culture  Setup  Time 01/09/2012 14:48   Final    Colony Count NO GROWTH   Final    Culture NO GROWTH   Final    Report Status 01/10/2012 FINAL   Final      Scheduled Meds:   . amLODipine  5 mg Oral Daily  . antiseptic oral rinse  15 mL Mouth Rinse q12n4p  . chlorhexidine  15 mL Mouth Rinse BID  . docusate sodium  100 mg Oral BID  . enoxaparin (LOVENOX) injection  40 mg Subcutaneous Q24H  . irbesartan  150 mg Oral Daily  . pantoprazole (PROTONIX) IV  40 mg Intravenous Q24H  . polyethylene glycol  17 g Oral Daily  . senna  2 tablet Oral BID  . simethicone  80 mg Oral TID  . simvastatin  20 mg Oral q1800  . DISCONTD: senna  1 tablet Oral BID   Continuous Infusions:   . sodium chloride 75 mL/hr at 01/12/12 1610     Niya Behler, DO  [facility] Pager [phone]  If 7PM-7AM, please contact night-coverage [email or web address] Password [email or web address] 01/12/2012, 3:22 PM   LOS: 3 days

## 2012-01-13 LAB — BASIC METABOLIC PANEL
BUN: 11 mg/dL (ref 6–23)
CO2: 24 mEq/L (ref 19–32)
Chloride: 104 mEq/L (ref 96–112)
Creatinine, Ser: 1.04 mg/dL (ref 0.50–1.10)
Potassium: 3.9 mEq/L (ref 3.5–5.1)

## 2012-01-13 MED ORDER — AMLODIPINE BESYLATE 5 MG PO TABS: 5.0000 mg | ORAL_TABLET | Freq: Every day | ORAL | Status: DC

## 2012-01-13 MED ORDER — ONDANSETRON HCL 4 MG PO TABS: 4.0000 mg | ORAL_TABLET | Freq: Three times a day (TID) | ORAL | Status: AC | PRN

## 2012-01-13 NOTE — Progress Notes (Signed)
Pt discharged to home by daughter discharge instructions explained to both pt and daughter. They both verbalized understanding. IV d/c'd

## 2012-01-13 NOTE — Discharge Summary (Signed)
Physician Discharge Summary  [name] [name]:[id number] DOB: 1941/03/20 DOA: 01/09/2012  PCP: Sissy Hoff, MD  Admit date: 01/09/2012 Discharge date: 01/13/2012  Recommendations for Outpatient Follow-up:  1. Pt will need to follow up with PCP in 2-3 weeks post discharge 2. Please obtain BMP to evaluate electrolytes and kidney function 3. Please also check CBC to evaluate Hg and Hct levels 4. Patient instructed to stop hydrochlorothiazide, continue Benicar  Discharge Diagnoses:  #1 acute pancreatitis.  -This is likely due to hydrochlorothiazide. The patient is status post cholecystectomy.  -Patient is able to tolerate cardiac diet without vomiting -Continues to have minor pain, but much improved -8/8 CT of the abdomen and pelvis did not show any biliary ductal dilatation nor any other acute findings.  -discussed with daughter  #2 leukocytosis.  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  -stable and improving on dual regimen of avapro and amlodipine -Further adjustment by primary care provider as necessary   Discharge Condition: Stable  Disposition:  discharge home to today  Diet: Cardiac prudent Wt Readings from Last 3 Encounters:  01/09/12 104.9 kg (231 lb 4.2 oz)  03/02/09 112.492 kg (248 lb)    History of present illness:  71 year old female presented to 30 hours of sudden onset abdominal pain. The patient was in her usual state of health prior to this. There was no vomiting but extreme nausea.  Hospital Course:  The patient was initially kept n.p.o.. she was advanced to liquid diet but continued to have abdominal pain. Gradually, her pain continued to  improve and she tolerated a clear liquid diet. Although she continued to have some mild pain, discretion improved and there was no vomiting. The patient tolerated cardiac diet without problems. Chest x-ray revealed an incidental right middle lobe nodule. CT of the chest revealed a benign subpleural lipoma. The patient's blood pressure gradually improved with addition of amlodipine to her angiotensin receptor blocker.  Discharge Exam: Filed Vitals:   01/13/12 1325  BP: 145/54  Pulse: 75  Temp: 97.9 F (36.6 C)  Resp: 20   Filed Vitals:   01/12/12 1413 01/12/12 2110 01/13/12 0613 01/13/12 1325  BP: 127/55 144/70 154/61 145/54  Pulse: 72 76 72 75  Temp: 98.1 F (36.7 C) 98.1 F (36.7 C) 98 F (36.7 C) 97.9 F (36.6 C)  TempSrc: Oral Oral    Resp: 18 18 19 20   Height:      Weight:      SpO2: 92% 94% 96% 96%   General: Alert and oriented x3, no apparent distress Cardiovascular: Regular rate and rhythm. 2/6 systolic murmur left lower sternal border. No rubs or gallops. Respiratory: Clear to auscultation. No wheezes or rhonchi Abdomen: Soft, nontender, positive bowel sounds  Discharge Instructions  Discharge Orders    Future Orders Please Complete By Expires   Diet - low sodium heart healthy      Increase activity slowly      Discharge instructions      Comments:   STOP hydrochlorothiazide (HCTZ) Continue Benicar     Medication List  As of 01/13/2012  3:04 PM   STOP taking these medications         hydrochlorothiazide 25 MG tablet  TAKE these medications         amLODipine 5 MG tablet   Commonly known as: NORVASC   Take 1 tablet (5 mg total) by mouth daily.      HYDROcodone-acetaminophen 5-500 MG per tablet   Commonly known as: VICODIN   Take 1 tablet by mouth every 6 (six) hours as needed. For pain      multivitamin with minerals Tabs   Take 1 tablet by mouth daily.      olmesartan 20 MG tablet   Commonly known as: BENICAR   Take 20 mg by mouth daily.        omeprazole 20 MG capsule   Commonly known as: PRILOSEC   Take 20 mg by mouth daily as needed. For acid reflux      ondansetron 4 MG tablet   Commonly known as: ZOFRAN   Take 1 tablet (4 mg total) by mouth every 8 (eight) hours as needed for nausea.      simvastatin 20 MG tablet   Commonly known as: ZOCOR   Take 20 mg by mouth every evening.             The results of significant diagnostics from this hospitalization (including imaging, microbiology, ancillary and laboratory) are listed below for reference.    Significant Diagnostic Studies: Ct Abdomen Pelvis Wo Contrast  01/09/2012  *RADIOLOGY REPORT*  Clinical Data: Epigastric pain and back pain.  Burning sensation. Elevated lipase.  Elevated white cell count.  Allergy to IV contrast material is reported.  CT ABDOMEN AND PELVIS WITHOUT CONTRAST  Technique:  Multidetector CT imaging of the abdomen and pelvis was performed following the standard protocol without intravenous contrast.  Comparison: None.  Findings: The lung bases are clear.  Coronary artery calcification. Small esophageal hiatal hernia.  There is infiltration in the fat around the head of the pancreas with infiltration extending to the anterior pararenal spaces bilaterally.  Changes are consistent with early pancreatitis.  No pancreatic ductal dilatation.  Mild fatty infiltration of the pancreas.  Surgical absence of the gallbladder.  No bile duct dilatation. Liver, spleen, and adrenal glands are unremarkable.  Calcification of abdominal aorta without aneurysm.  Staghorn calculi in the lower pole right kidney without obstruction.  No hydronephrosis in either kidney.  Hypo attenuating lesion in the left kidney measures 11 mm and probably represents a cyst.  No retroperitoneal lymphadenopathy.  Scattered mesenteric and celiac axis lymph nodes are not enlarged and probably are reactive.  Small midline abdominal wall hernia containing fat with scarring or fat necrosis. Prominent  visceral adipose tissues.  No free fluid or free air in the abdomen.  The stomach, small bowel, and colon are decompressed.  Pelvis:  Uterus is surgically absent.  No abnormal adnexal masses. No bladder wall thickening.  No free or loculated pelvic fluid collections.  Calcifications in the pelvis consistent with phleboliths.  No diverticulitis.  The appendix is not identified. The bowel, spondylolysis and mild spondylolisthesis at L5-S1. Degenerative changes in the spine.  IMPRESSION: Inflammatory changes around the head of the pancreas consistent with early pancreatitis.  No evidence of abscess or fluid collection.  Small midline abdominal wall hernia superiorly with scarring or fat necrosis.  Staghorn type nonobstructing stone in the right kidney.  Original Report Authenticated By: Marlon Pel, M.D.   Ct Chest Wo Contrast  01/12/2012  *RADIOLOGY REPORT*  Clinical Data: Lung nodule  CT CHEST WITHOUT CONTRAST  Technique:  Multidetector CT imaging of the chest was  performed following the standard protocol without IV contrast.  Comparison: 09/20/2009  Findings: 19 x 34 mm subpleural lipoma laterally at the right lung base as before.  There is some dependent subsegmental atelectasis or scarring posteriorly in both lower lobes.  Lungs otherwise clear.  No hilar or mediastinal adenopathy. No pleural or pericardial effusion.  Patchy coronary and aortic calcifications. Vascular clips noted in the gallbladder fossa.  Remainder visualized upper abdomen unremarkable.  IMPRESSION:  1.  Stable benign right subpleural lipoma. 2. Atherosclerosis, including . coronary artery disease. Please note that although the presence of coronary artery calcium documents the presence of coronary artery disease, the severity of this disease and any potential stenosis cannot be assessed on this non-gated CT examination.  Assessment for potential risk factor modification, dietary therapy or pharmacologic therapy may be warranted, if  clinically indicated.  Original Report Authenticated By: Osa Craver, M.D.   Mr Knee Left  Wo Contrast  01/03/2012  *RADIOLOGY REPORT*  Clinical Data:  Left knee pain and swelling.  MRI OF THE LEFT KNEE WITHOUT CONTRAST  Technique:  Multiplanar, multisequence MR imaging of the left knee was performed.  No intravenous contrast was administered.  Comparison:  None  FINDINGS: MENISCI Medial:  A small mid body and posterior horn tears are present. The anterior horn is intact. Lateral:  Discoid morphology.  No meniscal tear.  LIGAMENTS Cruciates:  Intact. Collaterals:  Intact.  CARTILAGE Patellofemoral:  Advanced degenerative chondrosis/chondromalacia. Medial:  Moderate to advanced degenerative chondrosis.  Small focal areas of suspected full-thickness cartilage loss. Lateral:  Moderate degenerative chondrosis.  Joint:  Small joint effusion. Popliteal Fossa:  No Baker's cyst. Extensor Mechanism:  Patella retinacular structures are intact. The quadriceps and patellar tendons are intact. Bones: No acute bony findings.  There is a small lesion in the lateral aspect of the lateral tibia near the tibiofibular joint. This could be a small bone infarct, benign bone cyst or intraosseous ganglion.  IMPRESSION:  1.  Tricompartmental degenerative change. 2.  Midbody and posterior horn medial meniscus tears. 3.  Intact ligamentous structures and no acute bony findings. 4.  Small joint effusion.  Original Report Authenticated By: P. Loralie Champagne, M.D.   Dg Abd Acute W/chest  01/11/2012  *RADIOLOGY REPORT*  Clinical Data: Generalized abdominal pain  ACUTE ABDOMEN SERIES (ABDOMEN 2 VIEW & CHEST 1 VIEW)  Comparison: Chest radiograph - 02/09/2009; chest CT - 09/20/2009; CT abdomen pelvis - 01/09/2012; abdominal radiograph - 01/09/2012  Findings:  Grossly unchanged enlarged cardiac silhouette and mediastinal contours.  There is persistent mild elevation of the left hemidiaphragm with left basilar linear heterogeneous  opacities. Previously characterize fat density nodule overlying the lateral right mid lung is unchanged.  No new or airspace opacities.  No definite pleural effusion or pneumothorax.  There is mild to moderate gaseous distension of multiple loops of large and small bowel.  No definite pneumoperitoneum, pneumatosis or portal venous gas. Opacity overlying the right mid hemiabdomen compatible with nonobstructing staghorn calculus seen on prior abdominal CT.  Post cholecystectomy.  Thoracolumbar spine degenerative change.  Degenerative change of the pubic symphysis.  IMPRESSION: 1.  No acute cardiopulmonary disease.  Grossly unchanged previously characterized fat density nodule overlying the peripheral aspect of the right mid lung.  2.  Mild to moderate gaseous distension of multiple loops of large and small bowel suggestive of ileus.  3. Right-sided staghorn calculus.  Original Report Authenticated By: Waynard Reeds, M.D.   Dg Abd Acute W/chest  01/09/2012  *  RADIOLOGY REPORT*  Clinical Data: Upper abdominal back pain and nausea.  ACUTE ABDOMEN SERIES (ABDOMEN 2 VIEW & CHEST 1 VIEW)  Comparison: 02/09/2009 chest x-ray  Findings: Shallow inspiration.  Linear fibrosis in the left lung base, stable.  Nodular opacity over the right mid lung is stable since the previous study and shown to represent a pleural fat collection on CT from 09/20/2009.  No focal airspace consolidation in the lungs.  No blunting of costophrenic angles.  No pneumothorax.  Mediastinal contours appear intact.  Normal heart size and pulmonary vascularity.  No significant change since previous chest radiograph.  Normal bowel gas pattern with scattered gas and stool in the colon. No small or large bowel distension.  Surgical clips in the right upper quadrant.  No free intra-abdominal air.  No abnormal air fluid levels.  Increased density in the right upper quadrant may represent renal stones.  This is stable since previous study. Degenerative changes  in the spine and hips.  Calcified phleboliths in the pelvis.  IMPRESSION: Stable appearance of the chest with chronic changes as described. No evidence of active pulmonary disease.  Nonobstructive bowel gas pattern.  Calcifications in the right abdomen may represent renal stones and are stable since previous study.  No free air.  Original Report Authenticated By: Marlon Pel, M.D.     Microbiology: Recent Results (from the past 240 hour(s))  CULTURE, BLOOD (ROUTINE X 2)     Status: Normal (Preliminary result)   Collection Time   01/09/12 10:00 AM      Component Value Range Status Comment   Specimen Description BLOOD RIGHT ARM   Final    Special Requests BOTTLES DRAWN AEROBIC AND ANAEROBIC 10CC   Final    Culture  Setup Time 01/09/2012 12:41   Final    Culture     Final    Value:        BLOOD CULTURE RECEIVED NO GROWTH TO DATE CULTURE WILL BE HELD FOR 5 DAYS BEFORE ISSUING A FINAL NEGATIVE REPORT   Report Status PENDING   Incomplete   CULTURE, BLOOD (ROUTINE X 2)     Status: Normal (Preliminary result)   Collection Time   01/09/12  1:15 PM      Component Value Range Status Comment   Specimen Description BLOOD RIGHT HAND   Final    Special Requests BOTTLES DRAWN AEROBIC ONLY 3CC   Final    Culture  Setup Time 01/09/2012 21:45   Final    Culture     Final    Value:        BLOOD CULTURE RECEIVED NO GROWTH TO DATE CULTURE WILL BE HELD FOR 5 DAYS BEFORE ISSUING A FINAL NEGATIVE REPORT   Report Status PENDING   Incomplete   URINE CULTURE     Status: Normal   Collection Time   01/09/12  2:15 PM      Component Value Range Status Comment   Specimen Description URINE, CLEAN CATCH   Final    Special Requests NONE   Final    Culture  Setup Time 01/09/2012 14:48   Final    Colony Count NO GROWTH   Final    Culture NO GROWTH   Final    Report Status 01/10/2012 FINAL   Final      Labs: Basic Metabolic Panel:  Lab 01/13/12 1610 01/12/12 0615 01/11/12 0555 01/10/12 0535 01/09/12 1342 01/09/12  0443  NA 136 135 138 135 -- 133*  K 3.9 3.8 -- -- -- --  CL 104 102 102 100 -- 96  CO2 24 24 28 24  -- 22  GLUCOSE 100* 85 100* 102* -- 132*  BUN 11 14 17 16  -- 26*  CREATININE 1.04 0.99 1.18* 0.99 0.91 --  CALCIUM 8.5 8.2* 8.3* 8.4 -- 9.5  MG -- -- -- -- -- --  PHOS -- -- -- -- -- --   Liver Function Tests:  Lab 01/10/12 0535 01/09/12 0443  AST 13 13  ALT 9 11  ALKPHOS 86 81  BILITOT 0.3 0.3  PROT 6.3 7.4  ALBUMIN 2.5* 3.4*    Lab 01/12/12 0615 01/09/12 0443  LIPASE 16 201*  AMYLASE -- --   No results found for this basename: AMMONIA:5 in the last 168 hours CBC:  Lab 01/12/12 0615 01/11/12 0555 01/10/12 0535 01/09/12 1342 01/09/12 0443  WBC 8.2 11.2* 17.9* 19.8* 18.6*  NEUTROABS -- -- -- -- 16.0*  HGB 9.4* 9.9* 10.7* 11.7* 11.8*  HCT 29.2* 31.8* 33.0* 35.6* 35.7*  MCV 86.1 86.2 84.4 84.2 84.0  PLT 229 233 218 184 238   Cardiac Enzymes: No results found for this basename: CKTOTAL:5,CKMB:5,CKMBINDEX:5,TROPONINI:5 in the last 168 hours BNP: No components found with this basename: POCBNP:5 CBG: No results found for this basename: GLUCAP:5 in the last 168 hours  Time coordinating discharge:  Greater than 30 minutes  Signed:  Karyna Bessler, DO [facility] Pager: [phone] 01/13/2012, 3:04 PM

## 2012-01-15 LAB — CULTURE, BLOOD (ROUTINE X 2): Culture: NO GROWTH

## 2012-03-10 ENCOUNTER — Encounter (HOSPITAL_COMMUNITY): Payer: Self-pay

## 2012-03-10 ENCOUNTER — Ambulatory Visit (HOSPITAL_COMMUNITY)
Admission: RE | Admit: 2012-03-10 | Discharge: 2012-03-10 | Disposition: A | Discharge status: Home / Self Care | Payer: Medicare Other | Source: Ambulatory Visit | Attending: Gastroenterology | Admitting: Gastroenterology

## 2012-03-10 NOTE — Anesthesia Preprocedure Evaluation (Addendum)
Anesthesia Evaluation    History of Anesthesia Complications Negative for: history of anesthetic complications  Airway Mallampati: II TM Distance: >3 FB Neck ROM: Full    Dental No notable dental hx.    Pulmonary  breath sounds clear to auscultation  Pulmonary exam normal       Cardiovascular hypertension, Pt. on medications Rhythm:Regular Rate:Normal     Neuro/Psych    GI/Hepatic GERD-  Medicated,  Endo/Other  Morbid obesity  Renal/GU      Musculoskeletal   Abdominal   Peds  Hematology   Anesthesia Other Findings   Reproductive/Obstetrics                          Anesthesia Physical Anesthesia Plan  ASA: III  Anesthesia Plan: MAC   Post-op Pain Management:    Induction:   Airway Management Planned: Nasal Cannula  Additional Equipment:   Intra-op Plan:   Post-operative Plan:   Informed Consent: I have reviewed the patients History and Physical, chart, labs and discussed the procedure including the risks, benefits and alternatives for the proposed anesthesia with the patient or authorized representative who has indicated his/her understanding and acceptance.     Plan Discussed with: CRNA and Surgeon  Anesthesia Plan Comments:        Anesthesia Quick Evaluation

## 2012-03-11 ENCOUNTER — Ambulatory Visit (HOSPITAL_COMMUNITY)
Admission: RE | Admit: 2012-03-11 | Discharge: 2012-03-11 | Disposition: A | Discharge status: Hospice | Payer: Medicare Other | Source: Ambulatory Visit | Attending: Gastroenterology | Admitting: Gastroenterology

## 2012-03-11 ENCOUNTER — Encounter (HOSPITAL_COMMUNITY): Payer: Self-pay | Admitting: Anesthesiology

## 2012-03-11 ENCOUNTER — Encounter (HOSPITAL_COMMUNITY): Payer: Self-pay | Admitting: *Deleted

## 2012-03-11 ENCOUNTER — Encounter (HOSPITAL_COMMUNITY)
Admission: RE | Disposition: A | Discharge status: Hospice | Payer: Self-pay | Source: Ambulatory Visit | Attending: Gastroenterology

## 2012-03-11 ENCOUNTER — Ambulatory Visit (HOSPITAL_COMMUNITY): Payer: Medicare Other | Admitting: Anesthesiology

## 2012-03-11 DIAGNOSIS — Z79899 Other long term (current) drug therapy: Secondary | ICD-10-CM | POA: Insufficient documentation

## 2012-03-11 DIAGNOSIS — K861 Other chronic pancreatitis: Secondary | ICD-10-CM | POA: Insufficient documentation

## 2012-03-11 DIAGNOSIS — R142 Eructation: Secondary | ICD-10-CM | POA: Insufficient documentation

## 2012-03-11 DIAGNOSIS — R141 Gas pain: Secondary | ICD-10-CM | POA: Insufficient documentation

## 2012-03-11 HISTORY — PX: EUS: SHX5427

## 2012-03-11 LAB — BASIC METABOLIC PANEL
CO2: 27 mEq/L (ref 19–32)
Calcium: 9.6 mg/dL (ref 8.4–10.5)
Chloride: 100 mEq/L (ref 96–112)
Creatinine, Ser: 1.42 mg/dL — ABNORMAL HIGH (ref 0.50–1.10)
Glucose, Bld: 110 mg/dL — ABNORMAL HIGH (ref 70–99)

## 2012-03-11 SURGERY — ESOPHAGEAL ENDOSCOPIC ULTRASOUND (EUS) RADIAL
Anesthesia: Monitor Anesthesia Care

## 2012-03-11 MED ORDER — BUTAMBEN-TETRACAINE-BENZOCAINE 2-2-14 % EX AERO
INHALATION_SPRAY | CUTANEOUS | Status: DC | PRN
  Administered 2012-03-11: 2 via TOPICAL

## 2012-03-11 MED ORDER — FENTANYL CITRATE 0.05 MG/ML IJ SOLN
INTRAMUSCULAR | Status: DC | PRN
  Administered 2012-03-11 (×2): 50 ug via INTRAVENOUS

## 2012-03-11 MED ORDER — SODIUM CHLORIDE 0.9 % IV SOLN: INTRAVENOUS | Status: DC

## 2012-03-11 MED ORDER — PROPOFOL 10 MG/ML IV EMUL
INTRAVENOUS | Status: DC | PRN
  Administered 2012-03-11: 70 ug/kg/min via INTRAVENOUS

## 2012-03-11 MED ORDER — MIDAZOLAM HCL 5 MG/5ML IJ SOLN
INTRAMUSCULAR | Status: DC | PRN
  Administered 2012-03-11: 2 mg via INTRAVENOUS

## 2012-03-11 MED ORDER — LACTATED RINGERS IV SOLN
INTRAVENOUS | Status: DC | PRN
  Administered 2012-03-11: 08:00:00 via INTRAVENOUS

## 2012-03-11 MED ORDER — KETAMINE HCL 50 MG/ML IJ SOLN
INTRAMUSCULAR | Status: DC | PRN
  Administered 2012-03-11: 50 mg via INTRAMUSCULAR

## 2012-03-11 NOTE — Op Note (Signed)
[facility] [address] [place], [address]   ENDOSCOPIC ULTRASOUND PROCEDURE REPORT  PATIENT: [name] [name], [name] [name]  MR#: [id number] BIRTHDATE: 11-03-1940  GENDER: Female ENDOSCOPIST: Willis Modena, MD REFERRED BY:  Tally Joe, M.D. PROCEDURE DATE:  03/11/2012 PROCEDURE:   Upper EUS w/FNA ASA CLASS:      Class III INDICATIONS:   1.  established chronic pancreatitis.   2.  abnormal CT of the GI tract.   3.  abdominal pain in upper right quadrant. MEDICATIONS: Cetacaine spray x 2 and MAC sedation, administered by CRNA  DESCRIPTION OF PROCEDURE:   After the risks benefits and alternatives of the procedure were  explained, informed consent was obtained. The patient was then placed in the left, lateral, decubitus postion and IV sedation was administered. Throughout the procedure, the patients blood pressure, pulse and oxygen saturations were monitored continuously.  Under direct visualization, the Pentax EUS Radial [id number]  endoscope was introduced through the mouth  and advanced to the second portion of the duodenum .  Water was used as necessary to provide an acoustic interface.  Upon completion of the imaging, water was removed and the patient was sent to the recovery room in satisfactory condition.    FINDINGS:      Scattered persistent inflammatory changes of the pancreas,  most pronounced in body and tail.  Head and uncinate pancreas hyperechoic consistent with fatty pancreas.  Bile duct not dilated, no wall thickening, and no choledocholithiasis. Post-cholecystectomy.  Suggestion of shadowing defect at portal vein confluence, suggestive of possible portal vein thrombosis.  No clear mass seen.  Pancreatic duct diminutive throughout. Normal-appearing ampulla via endoscopic ultrasound.  IMPRESSION:     As above.  No source of pancreatitis identified on today's exam.  Likely medication-related (HCTZ).  Possible portal vein thrombosis.  RECOMMENDATIONS:      1.  Watch for potential complications of procedure. 2.  Consider MRI/MRCP with contrast to assess for portal vein thrombosis, if her renal function improves.  If not, then we could consider abdominal ultrasound with duplex to assess for PVT. 3.  Will discuss with Dr. Bosie Clos.   _______________________________ Rosalie DoctorWillis Modena, MD 03/11/2012 9:12 AM   CC:

## 2012-03-11 NOTE — Transfer of Care (Signed)
Immediate Anesthesia Transfer of Care Note  Patient: [name] [name]  Procedure(s) Performed: Procedure(s) (LRB) with comments: ESOPHAGEAL ENDOSCOPIC ULTRASOUND (EUS) RADIAL (N/A) - PATIENT  coming in on oct.8th at 1300  Patient Location: PACU  Anesthesia Type mac  Level of Consciousness: sedated  Airway & Oxygen Therapy: Patient Spontanous Breathing and Patient connected to nasal cannula oxygen  Post-op Assessment: Report given to PACU RN and Post -op Vital signs reviewed and stable  Post vital signs: Reviewed and stable  Complications: No apparent anesthesia complications

## 2012-03-11 NOTE — Anesthesia Postprocedure Evaluation (Signed)
  Anesthesia Post-op Note  Patient: [name] [name]  Procedure(s) Performed: Procedure(s) (LRB): ESOPHAGEAL ENDOSCOPIC ULTRASOUND (EUS) RADIAL (N/A)  Patient Location: PACU  Anesthesia Type: MAC  Level of Consciousness: awake and alert   Airway and Oxygen Therapy: Patient Spontanous Breathing  Post-op Pain: mild  Post-op Assessment: Post-op Vital signs reviewed, Patient's Cardiovascular Status Stable, Respiratory Function Stable, Patent Airway and No signs of Nausea or vomiting  Post-op Vital Signs: stable  Complications: No apparent anesthesia complications

## 2012-03-11 NOTE — H&P (Signed)
Patient interval history reviewed.  Patient examined again.  There has been no change from documented H/P dated 03/10/12 (scanned into chart from our office) except as documented above.  Assessment:  1.  Idiopathic pancreatitis.  Possible HCTZ-related.  She is post cholecystectomy.  Some epigastric bloating and fullness.  EGD negative.  Plan:  1.  Endoscopic ultrasound today. 2.  Risks (bleeding, infection, bowel perforation that could require surgery, sedation-related changes in cardiopulmonary systems), benefits (identification and possible treatment of source of symptoms, exclusion of certain causes of symptoms), and alternatives (watchful waiting, radiographic imaging studies, empiric medical treatment) of upper endoscopy with ultrasound with possible biopsies (EUS with possible FNA) were explained to patient/daughter in detail and patient wishes to proceed.

## 2012-03-13 ENCOUNTER — Encounter (HOSPITAL_COMMUNITY): Payer: Self-pay | Admitting: Gastroenterology

## 2012-04-24 ENCOUNTER — Encounter (HOSPITAL_BASED_OUTPATIENT_CLINIC_OR_DEPARTMENT_OTHER): Payer: Self-pay | Admitting: *Deleted

## 2012-04-24 NOTE — Progress Notes (Signed)
To come in for bmet-was in hospital with pancreatitis due to hctz-chg to lasix. Does not have cardiac problems-she is going to see dr Darnelle Spangle soon to help with htn.

## 2012-04-27 ENCOUNTER — Encounter (HOSPITAL_BASED_OUTPATIENT_CLINIC_OR_DEPARTMENT_OTHER)
Admission: RE | Admit: 2012-04-27 | Discharge: 2012-04-27 | Disposition: A | Discharge status: Home / Self Care | Payer: Medicare Other | Source: Ambulatory Visit | Attending: Orthopedic Surgery | Admitting: Orthopedic Surgery

## 2012-04-27 LAB — BASIC METABOLIC PANEL
CO2: 29 mEq/L (ref 19–32)
Chloride: 101 mEq/L (ref 96–112)
GFR calc Af Amer: 44 mL/min — ABNORMAL LOW (ref >90)
Potassium: 4.3 mEq/L (ref 3.5–5.1)
Sodium: 139 mEq/L (ref 135–145)

## 2012-04-29 ENCOUNTER — Ambulatory Visit (HOSPITAL_BASED_OUTPATIENT_CLINIC_OR_DEPARTMENT_OTHER): Payer: Medicare Other | Admitting: Anesthesiology

## 2012-04-29 ENCOUNTER — Encounter (HOSPITAL_BASED_OUTPATIENT_CLINIC_OR_DEPARTMENT_OTHER): Payer: Self-pay | Admitting: Anesthesiology

## 2012-04-29 ENCOUNTER — Ambulatory Visit (HOSPITAL_BASED_OUTPATIENT_CLINIC_OR_DEPARTMENT_OTHER)
Admission: RE | Admit: 2012-04-29 | Discharge: 2012-04-29 | Disposition: A | Discharge status: Home / Self Care | Payer: Medicare Other | Source: Ambulatory Visit | Attending: Orthopedic Surgery | Admitting: Orthopedic Surgery

## 2012-04-29 ENCOUNTER — Encounter (HOSPITAL_BASED_OUTPATIENT_CLINIC_OR_DEPARTMENT_OTHER): Payer: Self-pay

## 2012-04-29 ENCOUNTER — Encounter (HOSPITAL_BASED_OUTPATIENT_CLINIC_OR_DEPARTMENT_OTHER)
Admission: RE | Disposition: A | Discharge status: Home / Self Care | Payer: Self-pay | Source: Ambulatory Visit | Attending: Orthopedic Surgery

## 2012-04-29 DIAGNOSIS — Z9089 Acquired absence of other organs: Secondary | ICD-10-CM | POA: Insufficient documentation

## 2012-04-29 DIAGNOSIS — Z90710 Acquired absence of both cervix and uterus: Secondary | ICD-10-CM | POA: Insufficient documentation

## 2012-04-29 DIAGNOSIS — I10 Essential (primary) hypertension: Secondary | ICD-10-CM | POA: Insufficient documentation

## 2012-04-29 DIAGNOSIS — D1620 Benign neoplasm of long bones of unspecified lower limb: Secondary | ICD-10-CM | POA: Insufficient documentation

## 2012-04-29 DIAGNOSIS — M2240 Chondromalacia patellae, unspecified knee: Secondary | ICD-10-CM | POA: Insufficient documentation

## 2012-04-29 DIAGNOSIS — M23349 Other meniscus derangements, anterior horn of lateral meniscus, unspecified knee: Secondary | ICD-10-CM | POA: Insufficient documentation

## 2012-04-29 DIAGNOSIS — E7800 Pure hypercholesterolemia, unspecified: Secondary | ICD-10-CM | POA: Insufficient documentation

## 2012-04-29 DIAGNOSIS — K219 Gastro-esophageal reflux disease without esophagitis: Secondary | ICD-10-CM | POA: Insufficient documentation

## 2012-04-29 DIAGNOSIS — M23329 Other meniscus derangements, posterior horn of medial meniscus, unspecified knee: Secondary | ICD-10-CM | POA: Insufficient documentation

## 2012-04-29 HISTORY — DX: Presence of dental prosthetic device (complete) (partial): Z97.2

## 2012-04-29 HISTORY — DX: Presence of spectacles and contact lenses: Z97.3

## 2012-04-29 HISTORY — PX: KNEE ARTHROSCOPY: SHX127

## 2012-04-29 HISTORY — DX: Complete loss of teeth, unspecified cause, unspecified class: K08.109

## 2012-04-29 LAB — POCT HEMOGLOBIN-HEMACUE: Hemoglobin: 10.2 g/dL — ABNORMAL LOW (ref 12.0–15.0)

## 2012-04-29 SURGERY — ARTHROSCOPY, KNEE
Anesthesia: General | Site: Knee | Laterality: Left | Wound class: Clean

## 2012-04-29 MED ORDER — BUPIVACAINE HCL (PF) 0.5 % IJ SOLN
INTRAMUSCULAR | Status: DC | PRN
  Administered 2012-04-29: 30 mL

## 2012-04-29 MED ORDER — FENTANYL CITRATE 0.05 MG/ML IJ SOLN: 50.0000 ug | Freq: Once | INTRAMUSCULAR | Status: DC

## 2012-04-29 MED ORDER — PROPOFOL 10 MG/ML IV BOLUS
INTRAVENOUS | Status: DC | PRN
  Administered 2012-04-29: 160 mg via INTRAVENOUS

## 2012-04-29 MED ORDER — OXYCODONE-ACETAMINOPHEN 5-325 MG PO TABS: 1.0000 | ORAL_TABLET | Freq: Four times a day (QID) | ORAL | Status: DC | PRN

## 2012-04-29 MED ORDER — EPINEPHRINE HCL 1 MG/ML IJ SOLN
INTRAMUSCULAR | Status: DC | PRN
  Administered 2012-04-29: 1 mg

## 2012-04-29 MED ORDER — CEFAZOLIN SODIUM-DEXTROSE 2-3 GM-% IV SOLR
INTRAVENOUS | Status: DC | PRN
  Administered 2012-04-29: 2 g via INTRAVENOUS

## 2012-04-29 MED ORDER — PROMETHAZINE HCL 25 MG/ML IJ SOLN: 6.2500 mg | INTRAMUSCULAR | Status: DC | PRN

## 2012-04-29 MED ORDER — LACTATED RINGERS IV SOLN
INTRAVENOUS | Status: DC
  Administered 2012-04-29 (×2): via INTRAVENOUS

## 2012-04-29 MED ORDER — FENTANYL CITRATE 0.05 MG/ML IJ SOLN
INTRAMUSCULAR | Status: DC | PRN
  Administered 2012-04-29 (×2): 50 ug via INTRAVENOUS

## 2012-04-29 MED ORDER — OXYCODONE HCL 5 MG/5ML PO SOLN: 5.0000 mg | Freq: Once | ORAL | Status: DC | PRN

## 2012-04-29 MED ORDER — LIDOCAINE HCL (CARDIAC) 20 MG/ML IV SOLN
INTRAVENOUS | Status: DC | PRN
  Administered 2012-04-29: 50 mg via INTRAVENOUS

## 2012-04-29 MED ORDER — DEXAMETHASONE SODIUM PHOSPHATE 4 MG/ML IJ SOLN
INTRAMUSCULAR | Status: DC | PRN
  Administered 2012-04-29: 8 mg via INTRAVENOUS

## 2012-04-29 MED ORDER — MIDAZOLAM HCL 5 MG/5ML IJ SOLN
INTRAMUSCULAR | Status: DC | PRN
  Administered 2012-04-29: 1 mg via INTRAVENOUS

## 2012-04-29 MED ORDER — SODIUM CHLORIDE 0.9 % IR SOLN
Status: DC | PRN
  Administered 2012-04-29: 6000 mL

## 2012-04-29 MED ORDER — HYDROMORPHONE HCL PF 1 MG/ML IJ SOLN
0.2500 mg | INTRAMUSCULAR | Status: DC | PRN
  Administered 2012-04-29 (×2): 0.5 mg via INTRAVENOUS

## 2012-04-29 MED ORDER — OXYCODONE HCL 5 MG PO TABS: 5.0000 mg | ORAL_TABLET | Freq: Once | ORAL | Status: DC | PRN

## 2012-04-29 MED ORDER — MIDAZOLAM HCL 2 MG/2ML IJ SOLN: 1.0000 mg | INTRAMUSCULAR | Status: DC | PRN

## 2012-04-29 SURGICAL SUPPLY — 40 items
BANDAGE ELASTIC 6 VELCRO ST LF (GAUZE/BANDAGES/DRESSINGS) ×2 IMPLANT
BLADE 4.2CUDA (BLADE) IMPLANT
BLADE GREAT WHITE 4.2 (BLADE) ×2 IMPLANT
CANISTER OMNI JUG 16 LITER (MISCELLANEOUS) ×2 IMPLANT
CANISTER SUCTION 2500CC (MISCELLANEOUS) IMPLANT
CLOTH BEACON ORANGE TIMEOUT ST (SAFETY) ×2 IMPLANT
CUTTER MENISCUS  4.2MM (BLADE)
CUTTER MENISCUS 4.2MM (BLADE) IMPLANT
DRAPE ARTHROSCOPY W/POUCH 114 (DRAPES) ×2 IMPLANT
DRSG EMULSION OIL 3X3 NADH (GAUZE/BANDAGES/DRESSINGS) ×2 IMPLANT
DURAPREP 26ML APPLICATOR (WOUND CARE) ×2 IMPLANT
ELECT MENISCUS 165MM 90D (ELECTRODE) IMPLANT
ELECT REM PT RETURN 9FT ADLT (ELECTROSURGICAL)
ELECTRODE REM PT RTRN 9FT ADLT (ELECTROSURGICAL) IMPLANT
GLOVE BIOGEL PI IND STRL 7.0 (GLOVE) ×1 IMPLANT
GLOVE BIOGEL PI IND STRL 8 (GLOVE) ×2 IMPLANT
GLOVE BIOGEL PI INDICATOR 7.0 (GLOVE) ×1
GLOVE BIOGEL PI INDICATOR 8 (GLOVE) ×2
GLOVE ECLIPSE 6.5 STRL STRAW (GLOVE) ×2 IMPLANT
GLOVE ECLIPSE 7.5 STRL STRAW (GLOVE) ×4 IMPLANT
GOWN BRE IMP PREV XXLGXLNG (GOWN DISPOSABLE) ×2 IMPLANT
GOWN PREVENTION PLUS XLARGE (GOWN DISPOSABLE) ×2 IMPLANT
GOWN PREVENTION PLUS XXLARGE (GOWN DISPOSABLE) ×4 IMPLANT
HOLDER KNEE FOAM BLUE (MISCELLANEOUS) ×2 IMPLANT
KNEE WRAP E Z 3 GEL PACK (MISCELLANEOUS) ×2 IMPLANT
NDL SAFETY ECLIPSE 18X1.5 (NEEDLE) IMPLANT
NEEDLE HYPO 18GX1.5 SHARP (NEEDLE)
PACK ARTHROSCOPY DSU (CUSTOM PROCEDURE TRAY) ×2 IMPLANT
PACK BASIN DAY SURGERY FS (CUSTOM PROCEDURE TRAY) ×2 IMPLANT
PAD CAST 4YDX4 CTTN HI CHSV (CAST SUPPLIES) ×1 IMPLANT
PADDING CAST COTTON 4X4 STRL (CAST SUPPLIES) ×1
PENCIL BUTTON HOLSTER BLD 10FT (ELECTRODE) IMPLANT
SET ARTHROSCOPY TUBING (MISCELLANEOUS) ×1
SET ARTHROSCOPY TUBING LN (MISCELLANEOUS) ×1 IMPLANT
SPONGE GAUZE 4X4 12PLY (GAUZE/BANDAGES/DRESSINGS) ×2 IMPLANT
SUT ETHILON 4 0 PS 2 18 (SUTURE) IMPLANT
SYR 5ML LL (SYRINGE) ×2 IMPLANT
TOWEL OR 17X24 6PK STRL BLUE (TOWEL DISPOSABLE) ×2 IMPLANT
TOWEL OR NON WOVEN STRL DISP B (DISPOSABLE) ×2 IMPLANT
WATER STERILE IRR 1000ML POUR (IV SOLUTION) ×2 IMPLANT

## 2012-04-29 NOTE — Brief Op Note (Signed)
04/29/2012  3:14 PM  PATIENT:  [name] [name]  71 y.o. female  PRE-OPERATIVE DIAGNOSIS:  medial menscus tear left knee  POST-OPERATIVE DIAGNOSIS:  partial medial and lateral menscus tear left knee, osteophyte, patella femoral chondromalacia  PROCEDURE:  Procedure(s) (LRB) with comments: ARTHROSCOPY KNEE (Left) - partial medial and lateral menisectomy, patella femoral chondroplasty, excision osteophyte left knee  SURGEON:  Surgeon(s) and Role:    * Harvie Junior, MD - Primary  PHYSICIAN ASSISTANT:   ASSISTANTS: bethune    ANESTHESIA:   general  EBL:  Total I/O In: 50 [I.V.:50] Out: -   BLOOD ADMINISTERED:none  DRAINS: none   LOCAL MEDICATIONS USED:  MARCAINE     SPECIMEN:  No Specimen  DISPOSITION OF SPECIMEN:  N/A  COUNTS:  YES  TOURNIQUET:  * No tourniquets in log *  DICTATION: .Other Dictation: Dictation Number [id number]  PLAN OF CARE: Discharge to home after PACU  PATIENT DISPOSITION:  PACU - hemodynamically stable.   Delay start of Pharmacological VTE agent (>24hrs) due to surgical blood loss or risk of bleeding: no

## 2012-04-29 NOTE — Anesthesia Preprocedure Evaluation (Signed)
Anesthesia Evaluation    History of Anesthesia Complications Negative for: history of anesthetic complications  Airway Mallampati: II TM Distance: >3 FB Neck ROM: Full    Dental No notable dental hx.    Pulmonary shortness of breath,  breath sounds clear to auscultation  Pulmonary exam normal       Cardiovascular hypertension, Pt. on medications Rhythm:Regular Rate:Normal     Neuro/Psych    GI/Hepatic GERD-  Medicated,  Endo/Other  Morbid obesity  Renal/GU      Musculoskeletal   Abdominal (+) + obese,   Peds  Hematology   Anesthesia Other Findings   Reproductive/Obstetrics                           Anesthesia Physical Anesthesia Plan  ASA: III  Anesthesia Plan: General   Post-op Pain Management:    Induction: Intravenous  Airway Management Planned: LMA  Additional Equipment:   Intra-op Plan:   Post-operative Plan: Extubation in OR  Informed Consent:   Plan Discussed with: CRNA and Surgeon  Anesthesia Plan Comments:         Anesthesia Quick Evaluation

## 2012-04-29 NOTE — Transfer of Care (Signed)
Immediate Anesthesia Transfer of Care Note  Patient: [name] [name]  Procedure(s) Performed: Procedure(s) (LRB) with comments: ARTHROSCOPY KNEE (Left) - partial medial and lateral menisectomy, patella femoral chondroplasty, excision osteophyte left knee  Patient Location: PACU  Anesthesia Type:General  Level of Consciousness: awake and alert   Airway & Oxygen Therapy: Patient Spontanous Breathing and Patient connected to face mask oxygen  Post-op Assessment: Report given to PACU RN and Post -op Vital signs reviewed and stable  Post vital signs: Reviewed and stable  Complications: No apparent anesthesia complications

## 2012-04-29 NOTE — Anesthesia Procedure Notes (Signed)
Procedure Name: LMA Insertion Date/Time: 04/29/2012 2:21 PM Performed by: Zenia Resides D Pre-anesthesia Checklist: Patient identified, Emergency Drugs available, Suction available, Patient being monitored and Timeout performed Patient Re-evaluated:Patient Re-evaluated prior to inductionOxygen Delivery Method: Circle System Utilized Preoxygenation: Pre-oxygenation with 100% oxygen Intubation Type: IV induction Ventilation: Mask ventilation without difficulty LMA: LMA with gastric port inserted LMA Size: 4.0 Number of attempts: 1 Placement Confirmation: positive ETCO2 Tube secured with: Tape Dental Injury: Teeth and Oropharynx as per pre-operative assessment

## 2012-04-29 NOTE — H&P (Signed)
PREOPERATIVE H&P  Chief Complaint: l knee pain  HPI: [name] [name] is a 71 y.o. female who presents for evaluation of l knee pain. It has been present for greater than 3 months and has been worsening. She has failed conservative measures. Pain is rated as moderate.  Past Medical History  Diagnosis Date  . GERD (gastroesophageal reflux disease)   . Hypertension   . High cholesterol   . Borderline diabetic   . Complication of anesthesia     needed more oxygen  . Shortness of breath   . Full dentures   . Wears glasses    Past Surgical History  Procedure Date  . Cholecystectomy   . Hernia repair   . Abdominal hysterectomy   . Tubal ligation   . Appendectomy   . Eus 03/11/2012    Procedure: ESOPHAGEAL ENDOSCOPIC ULTRASOUND (EUS) RADIAL;  Surgeon: Willis Modena, MD;  Location: [facility];  Service: Endoscopy;  Laterality: N/A;  PATIENT  coming in on oct.8th at 1300   History   Social History  . Marital Status: Widowed    Spouse Name: N/A    Number of Children: N/A  . Years of Education: N/A   Social History Main Topics  . Smoking status: Never Smoker   . Smokeless tobacco: Never Used  . Alcohol Use: No  . Drug Use: No  . Sexually Active: No   Other Topics Concern  . Not on file   Social History Narrative  . No narrative on file   No family history on file. Allergies  Allergen Reactions  . Iohexol Hives  . Other Other (See Comments)    Iv dye-hives    Prior to Admission medications   Medication Sig Start Date End Date Taking? Authorizing Provider  furosemide (LASIX) 40 MG tablet Take 40 mg by mouth daily.   Yes Historical Provider, MD  ferrous fumarate (HEMOCYTE - 106 MG FE) 325 (106 FE) MG TABS Take 1 tablet by mouth.    Historical Provider, MD  HYDROcodone-acetaminophen (VICODIN) 5-500 MG per tablet Take 1 tablet by mouth every 6 (six) hours as needed. For pain    Historical Provider, MD  Multiple Vitamin (MULTIVITAMIN WITH MINERALS) TABS Take 1 tablet  by mouth daily.    Historical Provider, MD  olmesartan (BENICAR) 20 MG tablet Take 40 mg by mouth daily.     Historical Provider, MD  omeprazole (PRILOSEC) 20 MG capsule Take 20 mg by mouth daily as needed. For acid reflux    Historical Provider, MD  simvastatin (ZOCOR) 20 MG tablet Take 20 mg by mouth every evening.    Historical Provider, MD     Positive ROS: none  All other systems have been reviewed and were otherwise negative with the exception of those mentioned in the HPI and as above.  Physical Exam: There were no vitals filed for this visit.  General: Alert, no acute distress Cardiovascular: No pedal edema Respiratory: No cyanosis, no use of accessory musculature GI: No organomegaly, abdomen is soft and non-tender Skin: No lesions in the area of chief complaint Neurologic: Sensation intact distally Psychiatric: Patient is competent for consent with normal mood and affect Lymphatic: No axillary or cervical lymphadenopathy  MUSCULOSKELETAL: painful l. Knee + mmt + McMurray - instability  Assessment/Plan: medial menscus tear left knee Plan for Procedure(s): ARTHROSCOPY KNEE  The risks benefits and alternatives were discussed with the patient including but not limited to the risks of nonoperative treatment, versus surgical intervention including infection, bleeding, nerve  injury, malunion, nonunion, hardware prominence, hardware failure, need for hardware removal, blood clots, cardiopulmonary complications, morbidity, mortality, among others, and they were willing to proceed.  Predicted outcome is good, although there will be at least a six to nine month expected recovery.  Harvie Junior, MD 04/29/2012 12:47 PM

## 2012-04-29 NOTE — Anesthesia Postprocedure Evaluation (Signed)
  Anesthesia Post-op Note  Patient: [name] [name]  Procedure(s) Performed: Procedure(s) (LRB) with comments: ARTHROSCOPY KNEE (Left) - partial medial and lateral menisectomy, patella femoral chondroplasty, excision osteophyte left knee  Patient Location: PACU  Anesthesia Type:General  Level of Consciousness: awake  Airway and Oxygen Therapy: Patient Spontanous Breathing  Post-op Pain: mild  Post-op Assessment: Post-op Vital signs reviewed, Patient's Cardiovascular Status Stable, Respiratory Function Stable, Patent Airway, No signs of Nausea or vomiting and Pain level controlled  Post-op Vital Signs: stable  Complications: No apparent anesthesia complications

## 2012-05-03 ENCOUNTER — Emergency Department (HOSPITAL_COMMUNITY): Payer: Medicare Other

## 2012-05-03 ENCOUNTER — Encounter (HOSPITAL_COMMUNITY): Payer: Self-pay | Admitting: Emergency Medicine

## 2012-05-03 ENCOUNTER — Emergency Department (HOSPITAL_COMMUNITY)
Admission: EM | Admit: 2012-05-03 | Discharge: 2012-05-03 | Disposition: A | Discharge status: Home / Self Care | Payer: Medicare Other | Attending: Emergency Medicine | Admitting: Emergency Medicine

## 2012-05-03 DIAGNOSIS — N390 Urinary tract infection, site not specified: Secondary | ICD-10-CM | POA: Insufficient documentation

## 2012-05-03 DIAGNOSIS — I10 Essential (primary) hypertension: Secondary | ICD-10-CM | POA: Insufficient documentation

## 2012-05-03 DIAGNOSIS — K1370 Unspecified lesions of oral mucosa: Secondary | ICD-10-CM | POA: Insufficient documentation

## 2012-05-03 DIAGNOSIS — R112 Nausea with vomiting, unspecified: Secondary | ICD-10-CM | POA: Insufficient documentation

## 2012-05-03 DIAGNOSIS — K5900 Constipation, unspecified: Secondary | ICD-10-CM | POA: Insufficient documentation

## 2012-05-03 DIAGNOSIS — Z9889 Other specified postprocedural states: Secondary | ICD-10-CM | POA: Insufficient documentation

## 2012-05-03 DIAGNOSIS — Z8709 Personal history of other diseases of the respiratory system: Secondary | ICD-10-CM | POA: Insufficient documentation

## 2012-05-03 DIAGNOSIS — E7800 Pure hypercholesterolemia, unspecified: Secondary | ICD-10-CM | POA: Insufficient documentation

## 2012-05-03 DIAGNOSIS — K859 Acute pancreatitis without necrosis or infection, unspecified: Secondary | ICD-10-CM | POA: Insufficient documentation

## 2012-05-03 DIAGNOSIS — R300 Dysuria: Secondary | ICD-10-CM | POA: Insufficient documentation

## 2012-05-03 DIAGNOSIS — Z79899 Other long term (current) drug therapy: Secondary | ICD-10-CM | POA: Insufficient documentation

## 2012-05-03 DIAGNOSIS — K219 Gastro-esophageal reflux disease without esophagitis: Secondary | ICD-10-CM | POA: Insufficient documentation

## 2012-05-03 LAB — CBC WITH DIFFERENTIAL/PLATELET
Basophils Relative: 0 % (ref 0–1)
Hemoglobin: 10.7 g/dL — ABNORMAL LOW (ref 12.0–15.0)
MCHC: 32.4 g/dL (ref 30.0–36.0)
Monocytes Relative: 8 % (ref 3–12)
Neutro Abs: 6.3 10*3/uL (ref 1.7–7.7)
Neutrophils Relative %: 71 % (ref 43–77)
RBC: 3.93 MIL/uL (ref 3.87–5.11)

## 2012-05-03 LAB — COMPREHENSIVE METABOLIC PANEL
ALT: 14 U/L (ref 0–35)
AST: 19 U/L (ref 0–37)
Albumin: 3.6 g/dL (ref 3.5–5.2)
Alkaline Phosphatase: 103 U/L (ref 39–117)
BUN: 25 mg/dL — ABNORMAL HIGH (ref 6–23)
Chloride: 96 mEq/L (ref 96–112)
Potassium: 3.7 mEq/L (ref 3.5–5.1)
Sodium: 136 mEq/L (ref 135–145)
Total Bilirubin: 0.3 mg/dL (ref 0.3–1.2)

## 2012-05-03 LAB — URINALYSIS, MICROSCOPIC ONLY
Bilirubin Urine: NEGATIVE
Glucose, UA: NEGATIVE mg/dL
Ketones, ur: NEGATIVE mg/dL
pH: 5.5 (ref 5.0–8.0)

## 2012-05-03 LAB — LIPASE, BLOOD: Lipase: 22 U/L (ref 11–59)

## 2012-05-03 MED ORDER — MAGNESIUM CITRATE PO SOLN: 296.0000 mL | Freq: Once | ORAL | Status: DC

## 2012-05-03 MED ORDER — ONDANSETRON HCL 4 MG/2ML IJ SOLN
4.0000 mg | Freq: Once | INTRAMUSCULAR | Status: AC
  Administered 2012-05-03: 4 mg via INTRAVENOUS
  Filled 2012-05-03: qty 2

## 2012-05-03 MED ORDER — POLYETHYLENE GLYCOL 3350 17 GM/SCOOP PO POWD: 17.0000 g | Freq: Every day | ORAL | Status: DC

## 2012-05-03 MED ORDER — SULFAMETHOXAZOLE-TRIMETHOPRIM 800-160 MG PO TABS: 1.0000 | ORAL_TABLET | Freq: Two times a day (BID) | ORAL | Status: DC

## 2012-05-03 MED ORDER — SODIUM CHLORIDE 0.9 % IV BOLUS (SEPSIS)
500.0000 mL | Freq: Once | INTRAVENOUS | Status: AC
  Administered 2012-05-03: 500 mL via INTRAVENOUS

## 2012-05-03 NOTE — ED Notes (Signed)
Bed:WA24<BR> Expected date:<BR> Expected time:<BR> Means of arrival:<BR> Comments:<BR> Triage 3

## 2012-05-03 NOTE — ED Notes (Signed)
Pt c/o abd pain.  No BM X 4 days.  H/o pancreatitis.  States her abd pain feels the same.  Burning upon urination.  Recent knee surgery.  Nausea, vomiting, afebrile.

## 2012-05-03 NOTE — ED Provider Notes (Signed)
History     CSN: [id number]  Arrival date & time 05/03/12  1037   First MD Initiated Contact with Patient 05/03/12 1212      Chief Complaint  Patient presents with  . Abdominal Pain  . Nausea  . Emesis  . Pancreatitis    (Consider location/radiation/quality/duration/timing/severity/associated sxs/prior treatment) Patient is a 71 y.o. female presenting with abdominal pain and vomiting. The history is provided by the patient.  Abdominal Pain The primary symptoms of the illness include abdominal pain, nausea and dysuria. The primary symptoms of the illness do not include shortness of breath, vomiting or diarrhea.  Additional symptoms associated with the illness include constipation. Symptoms associated with the illness do not include back pain.  Emesis  Associated symptoms include abdominal pain. Pertinent negatives include no diarrhea and no headaches.   patient had recent surgery on her left knee. Her left she had laparoscopic surgery. Since then she states she has had no bowel movement. His nauseousness without vomiting. She also has had some urinary frequency. No vomiting. Patient states that she cannot vomit. She states she has not vomited since she was 71 years old. She states that her daughter does not vomit either so it must be genetic. No fevers. She states that her knee is doing well. She states she's a history of pancreatitis and this also feels somewhat like that. The pain is dull it is over the whole abdomen.  Past Medical History  Diagnosis Date  . GERD (gastroesophageal reflux disease)   . Hypertension   . High cholesterol   . Borderline diabetic   . Complication of anesthesia     needed more oxygen  . Shortness of breath   . Full dentures   . Wears glasses     Past Surgical History  Procedure Date  . Cholecystectomy   . Hernia repair   . Abdominal hysterectomy   . Tubal ligation   . Appendectomy   . Eus 03/11/2012    Procedure: ESOPHAGEAL ENDOSCOPIC  ULTRASOUND (EUS) RADIAL;  Surgeon: Willis Modena, MD;  Location: [facility];  Service: Endoscopy;  Laterality: N/A;  PATIENT  coming in on oct.8th at 1300    History reviewed. No pertinent family history.  History  Substance Use Topics  . Smoking status: Never Smoker   . Smokeless tobacco: Never Used  . Alcohol Use: No    OB History    Grav Para Term Preterm Abortions TAB SAB Ect Mult Living                  Review of Systems  Constitutional: Negative for activity change and appetite change.  HENT: Negative for neck stiffness.   Eyes: Negative for pain.  Respiratory: Negative for chest tightness and shortness of breath.   Cardiovascular: Negative for chest pain and leg swelling.  Gastrointestinal: Positive for nausea, abdominal pain and constipation. Negative for vomiting and diarrhea.  Genitourinary: Positive for dysuria. Negative for flank pain.  Musculoskeletal: Negative for back pain.  Skin: Negative for rash.  Neurological: Negative for weakness, numbness and headaches.  Psychiatric/Behavioral: Negative for behavioral problems.    Allergies  Iohexol and Other  Home Medications   Current Outpatient Rx  Name  Route  Sig  Dispense  Refill  . VITAMIN D 1000 UNITS PO TABS   Oral   Take 1,000 Units by mouth daily.         Marland Kitchen FERROUS FUMARATE 325 (106 FE) MG PO TABS   Oral   Take 1  tablet by mouth.         . FUROSEMIDE 40 MG PO TABS   Oral   Take 40 mg by mouth daily.         . ADULT MULTIVITAMIN W/MINERALS CH   Oral   Take 1 tablet by mouth daily.         Marland Kitchen OLMESARTAN MEDOXOMIL 20 MG PO TABS   Oral   Take 40 mg by mouth daily.          . OXYCODONE-ACETAMINOPHEN 5-325 MG PO TABS   Oral   Take 1-2 tablets by mouth every 6 (six) hours as needed for pain.   40 tablet   0   . SIMVASTATIN 20 MG PO TABS   Oral   Take 20 mg by mouth every evening.         Marland Kitchen MAGNESIUM CITRATE PO SOLN   Oral   Take 296 mLs by mouth once.   300 mL   0   .  POLYETHYLENE GLYCOL 3350 PO POWD   Oral   Take 17 g by mouth daily.   255 g   0   . SULFAMETHOXAZOLE-TRIMETHOPRIM 800-160 MG PO TABS   Oral   Take 1 tablet by mouth 2 (two) times daily.   6 tablet   0     BP 196/68  Pulse 70  Temp 98.2 F (36.8 C) (Oral)  Resp 18  SpO2 95%  Physical Exam  Nursing note and vitals reviewed. Constitutional: She is oriented to person, place, and time. She appears well-developed and well-nourished.  HENT:  Head: Normocephalic and atraumatic.  Eyes: EOM are normal. Pupils are equal, round, and reactive to light.  Neck: Normal range of motion. Neck supple.  Cardiovascular: Normal rate, regular rhythm and normal heart sounds.   No murmur heard. Pulmonary/Chest: Effort normal and breath sounds normal. No respiratory distress. She has no wheezes. She has no rales.  Abdominal: Soft. Bowel sounds are normal. She exhibits no distension and no mass. There is no tenderness. There is no rebound and no guarding.  Musculoskeletal: Normal range of motion.  Neurological: She is alert and oriented to person, place, and time. No cranial nerve deficit.  Skin: Skin is warm and dry.       Left knee postsurgical with Band-Aids. No erythema  Psychiatric: She has a normal mood and affect. Her speech is normal.    ED Course  Procedures (including critical care time)  Labs Reviewed  CBC WITH DIFFERENTIAL - Abnormal; Notable for the following:    Hemoglobin 10.7 (*)     HCT 33.0 (*)     All other components within normal limits  COMPREHENSIVE METABOLIC PANEL - Abnormal; Notable for the following:    Glucose, Bld 104 (*)     BUN 25 (*)     Creatinine, Ser 1.34 (*)     GFR calc non Af Amer 39 (*)     GFR calc Af Amer 45 (*)     All other components within normal limits  URINALYSIS, MICROSCOPIC ONLY - Abnormal; Notable for the following:    APPearance CLOUDY (*)     Hgb urine dipstick TRACE (*)     Leukocytes, UA LARGE (*)     Bacteria, UA MANY (*)      Squamous Epithelial / LPF MANY (*)     All other components within normal limits  LIPASE, BLOOD  URINE CULTURE   Dg Abd Acute W/chest  05/03/2012  *RADIOLOGY REPORT*  Clinical Data: Abdominal pain with nausea and vomiting.  History of pancreatitis  ACUTE ABDOMEN SERIES (ABDOMEN 2 VIEW & CHEST 1 VIEW)  Comparison: 01/11/2012  Findings: Right lateral lung density is stable and compatible with an extrapleural lipoma based on CT of 01/12/2012.  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 No heart failure or pneumonia.  Surgical clips in the gallbladder fossa.  Moderate stool in the right colon.  Gas in nondilated small bowel.  There is gas in the rectum.  No free air is identified.  There is a 10 x 15 mm right renal calculus.  This is unchanged.  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  No acute cardiopulmonary disease.  Constipation without bowel obstruction.  Right renal calculus is unchanged.   Original Report Authenticated By: Janeece Riggers, M.D.      1. Constipation   2. UTI (urinary tract infection)       MDM   patient with abdominal pain and constipation the last 4 days. She is on Percocet after a knee surgery. No fevers. She has had nausea. She states that the pain feel like her previous pancreatitis. She also has been having urinary frequency. She has and apparent UTI. Constipation without obstruction on xray. She is not on any laxatives. Will treat with abx and laxatives. Doubt pancreatitis with normal lipase. Will d/c home.         Juliet Rude. Rubin Payor, MD 05/03/12 1422

## 2012-05-03 NOTE — ED Notes (Signed)
Notified MD (Pickering) about BP. MD advised that this will be followed up with outpatient. Pt indicated that she has an appointment with her knee surgeon tomorrow morning.

## 2012-05-04 ENCOUNTER — Encounter (HOSPITAL_BASED_OUTPATIENT_CLINIC_OR_DEPARTMENT_OTHER): Payer: Self-pay | Admitting: Orthopedic Surgery

## 2012-05-04 LAB — URINE CULTURE
Colony Count: NO GROWTH
Culture: NO GROWTH

## 2012-05-04 NOTE — Op Note (Signed)
[name], [name] NO.:  [id number]  MEDICAL RECORD NO.:  [id number]  LOCATION:                                 FACILITY:  PHYSICIAN:  Harvie Junior, M.D.   DATE OF BIRTH:  11-Nov-1940  DATE OF PROCEDURE:  04/29/2012 DATE OF DISCHARGE:                              OPERATIVE REPORT   PREOPERATIVE DIAGNOSIS:  Medial meniscal tear with tricompartmental degenerative change.  POSTOPERATIVE DIAGNOSES: 1. Medial meniscal tear in the midbody and posterior horn. 2. Anterior horn lateral meniscal tear. 3. Chondromalacia of patellofemoral joint. 4. Bony osteophyte in the central portion of the knee.  PRINCIPAL PROCEDURES: 1. Partial medial meniscectomy in the midbody and posterior horn with     corresponding debridement in the medial compartment. 2. Debridement of anterior horn lateral meniscal tear with     corresponding debridement in the lateral compartment. 3. Debridement of chondromalacia patella. 4. Removal of large bony osteophyte in the central portion of the     knee.  SURGEON:  Harvie Junior, M.D.  ASSISTANT:  Marshia Ly, PA.  ANESTHESIA:  General.  BRIEF HISTORY:  [name] [name] is a 71 year old female with a long history of having had significant left knee pain.  She had been treated conservatively for period of time with activity modification, injection therapy, and because of failure of all conservative care, she ultimately underwent MRI examination and showed she had a midbody and posterior horn medial meniscal tear.  Because of continued complaints of pain, catching, grabbing in the knee and failure of conservative care, she was taken to the operating room for operative knee arthroscopy.  PROCEDURE:  The patient was brought to the operating room and after adequate level of anesthesia was obtained with general anesthetic, the patient was placed supine on the operating table.  The left leg was then prepped and draped in usual sterile  fashion.  Following this, the routine arthroscopic examination of the left leg was performed and showed that she had a midbody medial meniscal tear and a posterior horn meniscal tear.  These were debrided back to a smooth and stable rim. She had significant chondromalacia of grade 3 and maybe even some early grade 4 changes on the medial femoral condyle.  The medial tibial plateau had grade 2 changes, these were all debrided in the medial compartment.  Attention was turned to the notch where there was a large osteophyte, we used an osteotome and freed up the osteophyte and then removed it with a grasper.  The attention was then turned laterally where the lateral meniscus was a little bit of fray in the outer edge, but not dramatic, but in the anterior horn, there was a tear of the meniscus, this was debrided back to a smooth and stable rim.  Lateral and femoral condyle had some grade 1 chondromalacia, which was debrided. I tested up the patellofemoral joint, which had grade 3 chondromalacia and some areas of maybe grade 4 chondromalacia was debrided back to a smooth and stable rim.  Attention was then turned to the knee, which was copiously and thoroughly lavaged with 6 liters of normal saline irrigation and suctioned dry.  The arthroscopic portals were  closed with bandage.  Sterile compressive dressing was applied.  The patient was taken to the recovery room, she was noted to be in satisfactory condition.  A 30 mL of one 0.25% Marcaine was instilled into the knee and around the portals for postoperative anesthesia.     Harvie Junior, M.D.     Ranae Plumber  D:  04/29/2012  T:  04/30/2012  Job:  [id number]

## 2013-09-27 IMAGING — CR DG ABDOMEN ACUTE W/ 1V CHEST
3 series · 3 of 3 positions shown · non-contrast
Comparison: 02/09/2009 chest x-ray

CLINICAL DATA: Upper abdominal back pain and nausea.

ACUTE ABDOMEN SERIES (ABDOMEN 2 VIEW & CHEST 1 VIEW)

[w chest pa]
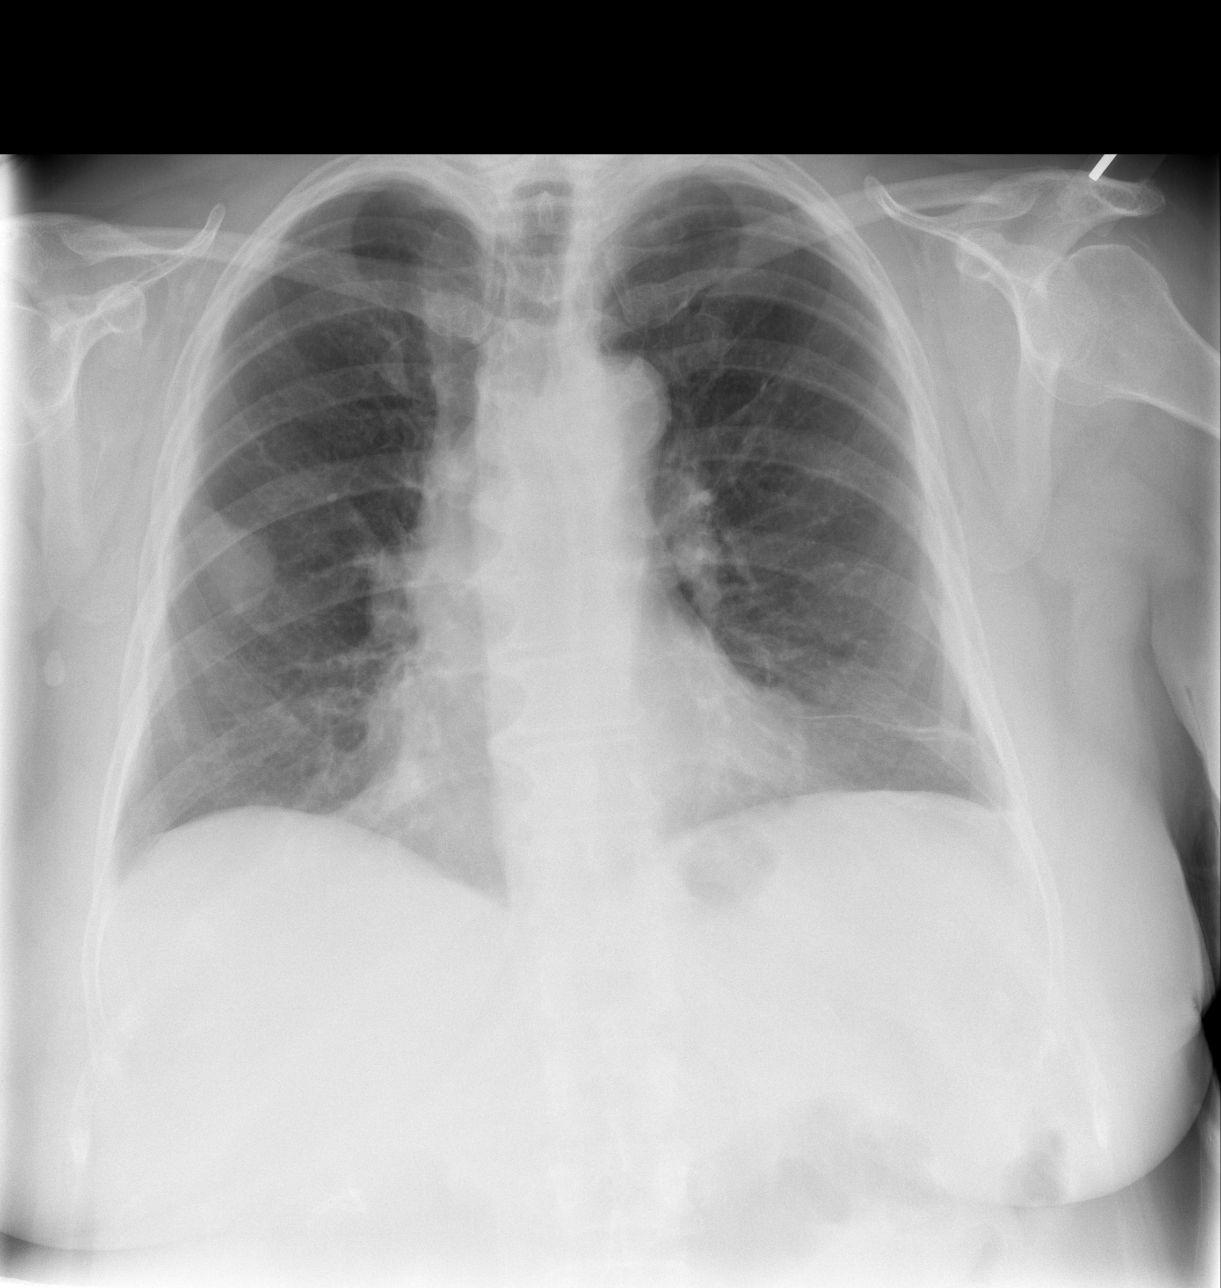

[w abdomen upright]
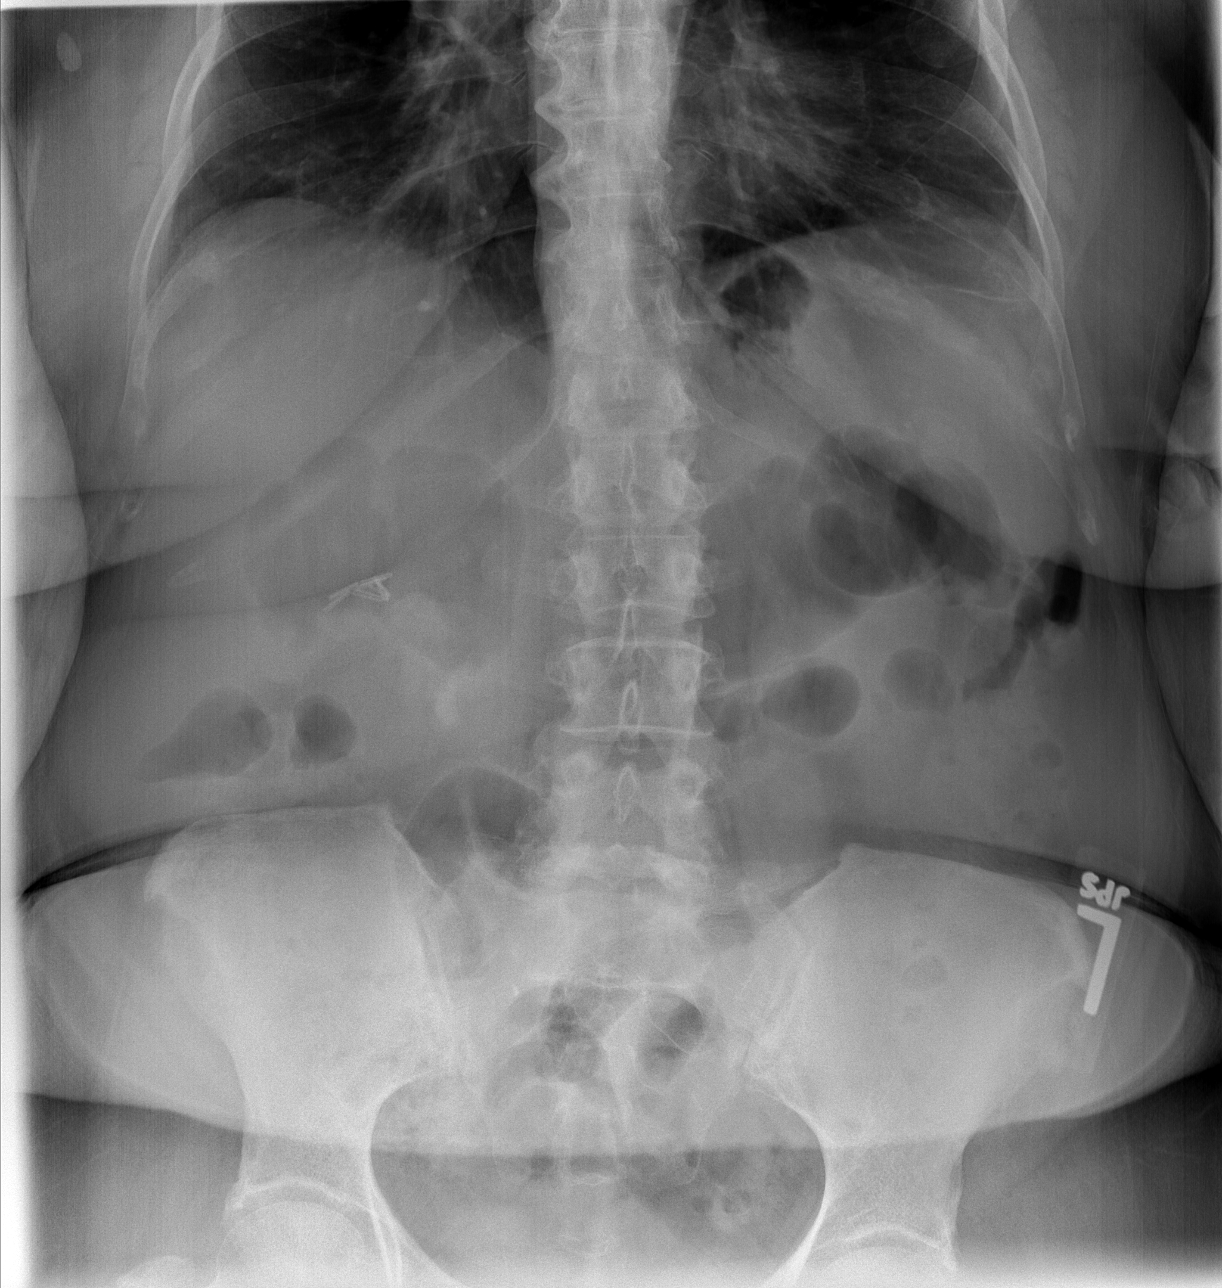

[t abdomen supine]
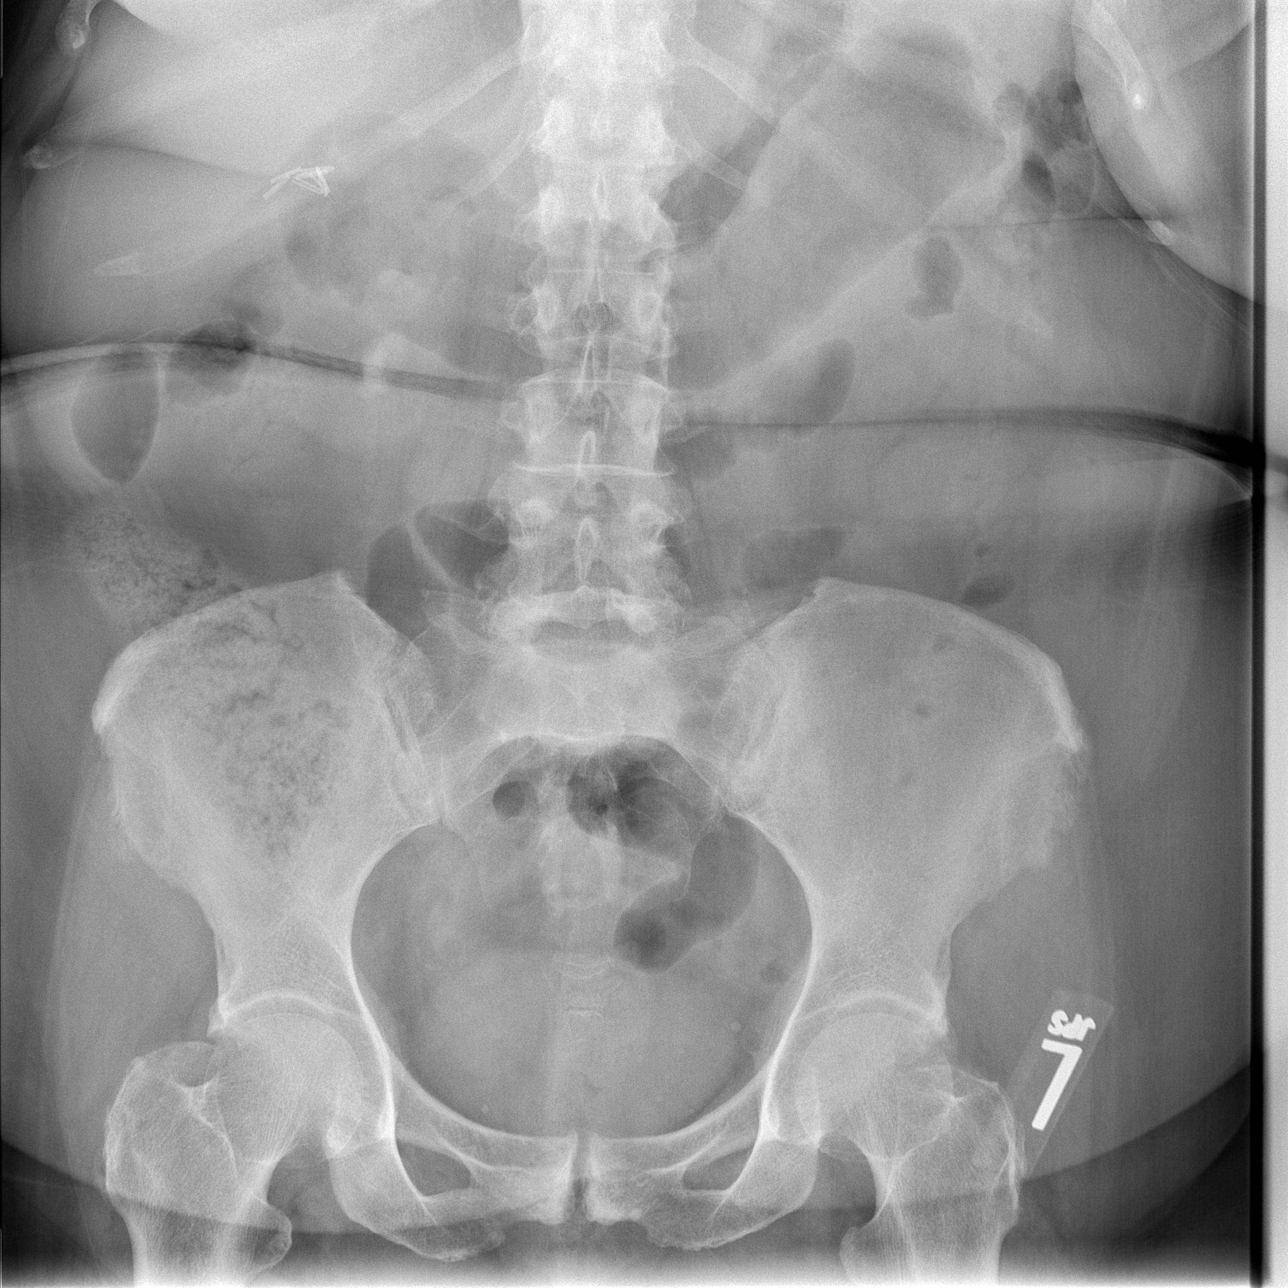

[3 of 3 positions shown; findings below may reference images not displayed]

FINDINGS: Shallow inspiration.  Linear fibrosis in the left lung
base, stable.  Nodular opacity over the right mid lung is stable
since the previous study and shown to represent a pleural fat
collection on CT from 09/20/2009.  No focal airspace consolidation
in the lungs.  No blunting of costophrenic angles.  No
pneumothorax.  Mediastinal contours appear intact.  Normal heart
size and pulmonary vascularity.  No significant change since
previous chest radiograph.

Normal bowel gas pattern with scattered gas and stool in the colon.
No small or large bowel distension.  Surgical clips in the right
upper quadrant.  No free intra-abdominal air.  No abnormal air
fluid levels.  Increased density in the right upper quadrant may
represent renal stones.  This is stable since previous study.
Degenerative changes in the spine and hips.  Calcified phleboliths
in the pelvis.
IMPRESSION: Stable appearance of the chest with chronic changes as described.
No evidence of active pulmonary disease.  Nonobstructive bowel gas
pattern.  Calcifications in the right abdomen may represent renal
stones and are stable since previous study.  No free air.

## 2015-04-17 ENCOUNTER — Ambulatory Visit (INDEPENDENT_AMBULATORY_CARE_PROVIDER_SITE_OTHER): Payer: Medicare Other | Admitting: Cardiology

## 2015-04-17 ENCOUNTER — Encounter: Payer: Self-pay | Admitting: Cardiology

## 2015-04-17 VITALS — BP 184/90 | HR 76 | Ht 60.0 in | Wt 234.0 lb

## 2015-04-17 DIAGNOSIS — E785 Hyperlipidemia, unspecified: Secondary | ICD-10-CM | POA: Diagnosis not present

## 2015-04-17 DIAGNOSIS — I10 Essential (primary) hypertension: Secondary | ICD-10-CM

## 2015-04-17 DIAGNOSIS — E669 Obesity, unspecified: Secondary | ICD-10-CM

## 2015-04-17 MED ORDER — AMLODIPINE BESYLATE 10 MG PO TABS: 10.0000 mg | ORAL_TABLET | Freq: Every day | ORAL | Status: AC

## 2015-04-17 MED ORDER — PRAVASTATIN SODIUM 40 MG PO TABS: 40.0000 mg | ORAL_TABLET | Freq: Every evening | ORAL | Status: DC

## 2015-04-17 NOTE — Patient Instructions (Signed)
Medication Instructions:   INCREASE AMLODIPINE TO 10 MG ONCE DAILY= 2 OF THE 5 MG TABLETS ONCE DAILY  STOP SIMVASTATIN  START PRAVASTATIN 40 MG ONCE DAILY  Labwork:  Your physician recommends that you return for lab work in: 4 WEEKS= DO NOT EAT PRIOR TO LAB WORK    Follow-Up:  Your physician recommends that you schedule a follow-up appointment in: [place]   If you need a refill on your cardiac medications before your next appointment, please call your pharmacy.

## 2015-04-17 NOTE — Assessment & Plan Note (Signed)
Blood pressure elevated. Increase amlodipine to 10 mg daily. She will follow her blood pressure at home and keep records. She will bring her cuff for correlation with ours at next office visit. Further adjustment and follow-up readings.

## 2015-04-17 NOTE — Assessment & Plan Note (Signed)
Discussed need for weight loss.She states she has lost approximately 8-9 pounds in the past 10 months.

## 2015-04-17 NOTE — Assessment & Plan Note (Signed)
I am increasing amlodipine to 10 mg daily for blood pressure. Given potential interaction with simvastatin I will discontinue this medicine and instead treat with Pravachol 40 mg daily. Check lipids and liver in 4 weeks.

## 2015-04-17 NOTE — Progress Notes (Signed)
HPI: 74 year old female for evaluation of hypertension. Echocardiogram December 2013 showed normal LV function, mild left ventricular hypertrophy and mild mitral regurgitation. Patient has dyspnea on exertion but no orthopnea, PND, palpitations, syncope or chest pain. Occasional mild pedal edema which improves with as needed Lasix. Patient states she had pancreatitis several years ago and her hydrochlorothiazide was discontinued. Since that time she has had difficulties controlling blood pressure. She therefore presents for further evaluation.  Current Outpatient Prescriptions  Medication Sig Dispense Refill  . amLODipine (NORVASC) 10 MG tablet Take 1 tablet (10 mg total) by mouth daily. 90 tablet 3  . Cholecalciferol (VITAMIN D PO) Take 20,000 Units by mouth once a week.    . ferrous sulfate 325 (65 FE) MG tablet Take 325 mg by mouth daily with breakfast.    . furosemide (LASIX) 40 MG tablet Take 40 mg by mouth daily.    Marland Kitchen HYDROcodone-acetaminophen (NORCO/VICODIN) 5-325 MG per tablet Take 1 tablet by mouth every 6 (six) hours as needed for moderate pain.    . Multiple Vitamins-Minerals (CENTRUM SILVER PO) Take by mouth.    . olmesartan (BENICAR) 20 MG tablet Take 40 mg by mouth daily.     Marland Kitchen omeprazole (PRILOSEC) 20 MG capsule Take 20 mg by mouth daily.    . pravastatin (PRAVACHOL) 40 MG tablet Take 1 tablet (40 mg total) by mouth every evening. 90 tablet 3   No current facility-administered medications for this visit.    Allergies  Allergen Reactions  . Hydrochlorothiazide     pancreatitis  . Iohexol Hives  . Other Other (See Comments)    Iv dye-hives      Past Medical History  Diagnosis Date  . GERD (gastroesophageal reflux disease)   . Hypertension   . High cholesterol   . Borderline diabetic   . Full dentures   . Wears glasses   . Nephrolithiasis   . Renal insufficiency   . Pancreatitis   . PUD (peptic ulcer disease)     Past Surgical History  Procedure  Laterality Date  . Cholecystectomy    . Hernia repair    . Abdominal hysterectomy    . Tubal ligation    . Appendectomy    . Eus  03/11/2012    Procedure: ESOPHAGEAL ENDOSCOPIC ULTRASOUND (EUS) RADIAL;  Surgeon: Arta Silence, MD;  Location: [facility];  Service: Endoscopy;  Laterality: N/A;  PATIENT  coming in on oct.8th at 1300  . Knee arthroscopy  04/29/2012    Procedure: ARTHROSCOPY KNEE;  Surgeon: Alta Corning, MD;  Location: [facility];  Service: Orthopedics;  Laterality: Left;  partial medial and lateral menisectomy, patella femoral chondroplasty, excision osteophyte left knee    Social History   Social History  . Marital Status: Widowed    Spouse Name: N/A  . Number of Children: 2  . Years of Education: N/A   Occupational History  . Not on file.   Social History Main Topics  . Smoking status: Never Smoker   . Smokeless tobacco: Never Used  . Alcohol Use: No  . Drug Use: No  . Sexual Activity: No   Other Topics Concern  . Not on file   Social History Narrative    Family History  Problem Relation Age of Onset  . Heart disease      No family history  . Hypertension Mother     ROS: no fevers or chills, productive cough, hemoptysis, dysphasia, odynophagia, melena, hematochezia, dysuria, hematuria, rash,  seizure activity, orthopnea, PND, pedal edema, claudication. Remaining systems are negative.  Physical Exam:   Blood pressure 184/90, pulse 76, height 5' (1.524 m), weight 106.142 kg (234 lb).  General:  Well developed/obese in NAD Skin warm/dry Patient not depressed No peripheral clubbing Back-normal HEENT-normal/normal eyelids Neck supple/normal carotid upstroke bilaterally; no bruits; no JVD; no thyromegaly chest - CTA/ normal expansion CV - RRR/normal S1 and S2; no murmurs, rubs or gallops;  PMI nondisplaced Abdomen -NT/ND, no HSM, no mass, + bowel sounds, no bruit 2+ femoral pulses, no bruits Ext-trace edema, no chords, 2+  DP Neuro-grossly nonfocal  ECG NSR Left ventricular hypertrophy

## 2015-04-20 ENCOUNTER — Encounter: Payer: Self-pay | Admitting: Cardiology

## 2015-04-20 ENCOUNTER — Telehealth: Payer: Self-pay | Admitting: Cardiology

## 2015-04-20 NOTE — Telephone Encounter (Signed)
Received records from [place] for appointment on 07/28/15 with Dr Stanford Breed.  Records given to [facility] (medical records) for Dr Jacalyn Lefevre schedule on 07/28/15. lp

## 2015-04-25 ENCOUNTER — Encounter: Payer: Self-pay | Admitting: Cardiology

## 2015-06-22 LAB — HEPATIC FUNCTION PANEL
ALT: 13 U/L (ref 6–29)
AST: 18 U/L (ref 10–35)
Albumin: 4 g/dL (ref 3.6–5.1)
Alkaline Phosphatase: 79 U/L (ref 33–130)
BILIRUBIN DIRECT: 0.1 mg/dL (ref <=0.2)
BILIRUBIN INDIRECT: 0.2 mg/dL (ref 0.2–1.2)
TOTAL PROTEIN: 6.9 g/dL (ref 6.1–8.1)
Total Bilirubin: 0.3 mg/dL (ref 0.2–1.2)

## 2015-06-22 LAB — LIPID PANEL
CHOL/HDL RATIO: 3.1 ratio (ref <=5.0)
CHOLESTEROL: 206 mg/dL — AB (ref 125–200)
HDL: 66 mg/dL (ref >=46)
LDL Cholesterol: 115 mg/dL (ref <130)
TRIGLYCERIDES: 125 mg/dL (ref <150)
VLDL: 25 mg/dL (ref <30)

## 2015-06-23 ENCOUNTER — Encounter: Payer: Self-pay | Admitting: *Deleted

## 2015-07-26 NOTE — Progress Notes (Signed)
HPI: FU hypertension. Echocardiogram December 2013 showed normal LV function, mild left ventricular hypertrophy and mild mitral regurgitation. Patient had pancreatitis with HCTZ. Since last seen, the patient has dyspnea with more extreme activities but not with routine activities. It is relieved with rest. It is not associated with chest pain. There is no orthopnea, PND. There is no syncope or palpitations. There is no exertional chest pain. Trace pedal edema.   Current Outpatient Prescriptions  Medication Sig Dispense Refill  . amLODipine (NORVASC) 10 MG tablet Take 1 tablet (10 mg total) by mouth daily. 90 tablet 3  . Cholecalciferol (VITAMIN D PO) Take 20,000 Units by mouth once a week.    . ferrous sulfate 325 (65 FE) MG tablet Take 325 mg by mouth daily with breakfast.    . furosemide (LASIX) 40 MG tablet Take 40 mg by mouth daily.    Marland Kitchen HYDROcodone-acetaminophen (NORCO/VICODIN) 5-325 MG per tablet Take 1 tablet by mouth every 6 (six) hours as needed for moderate pain.    . Multiple Vitamins-Minerals (CENTRUM SILVER PO) Take by mouth.    . olmesartan (BENICAR) 20 MG tablet Take 40 mg by mouth daily.     Marland Kitchen omeprazole (PRILOSEC) 20 MG capsule Take 20 mg by mouth daily.    . pravastatin (PRAVACHOL) 40 MG tablet Take 1 tablet (40 mg total) by mouth every evening. 90 tablet 3   No current facility-administered medications for this visit.     Past Medical History  Diagnosis Date  . GERD (gastroesophageal reflux disease)   . Hypertension   . High cholesterol   . Borderline diabetic   . Full dentures   . Wears glasses   . Nephrolithiasis   . Renal insufficiency   . Pancreatitis   . PUD (peptic ulcer disease)     Past Surgical History  Procedure Laterality Date  . Cholecystectomy    . Hernia repair    . Abdominal hysterectomy    . Tubal ligation    . Appendectomy    . Eus  03/11/2012    Procedure: ESOPHAGEAL ENDOSCOPIC ULTRASOUND (EUS) RADIAL;  Surgeon: Arta Silence,  MD;  Location: [facility];  Service: Endoscopy;  Laterality: N/A;  PATIENT  coming in on oct.8th at 1300  . Knee arthroscopy  04/29/2012    Procedure: ARTHROSCOPY KNEE;  Surgeon: Alta Corning, MD;  Location: [facility];  Service: Orthopedics;  Laterality: Left;  partial medial and lateral menisectomy, patella femoral chondroplasty, excision osteophyte left knee    Social History   Social History  . Marital Status: Widowed    Spouse Name: N/A  . Number of Children: 2  . Years of Education: N/A   Occupational History  . Not on file.   Social History Main Topics  . Smoking status: Never Smoker   . Smokeless tobacco: Never Used  . Alcohol Use: No  . Drug Use: No  . Sexual Activity: No   Other Topics Concern  . Not on file   Social History Narrative    Family History  Problem Relation Age of Onset  . Heart disease      No family history  . Hypertension Mother     ROS: no fevers or chills, productive cough, hemoptysis, dysphasia, odynophagia, melena, hematochezia, dysuria, hematuria, rash, seizure activity, orthopnea, PND, pedal edema, claudication. Remaining systems are negative.  Physical Exam: Well-developed obese in no acute distress.  Skin is warm and dry.  HEENT is normal.  Neck is  supple.  Chest is clear to auscultation with normal expansion.  Cardiovascular exam is regular rate and rhythm.  Abdominal exam nontender or distended. No masses palpated. Extremities show trace edema. neuro grossly intact

## 2015-07-28 ENCOUNTER — Ambulatory Visit (INDEPENDENT_AMBULATORY_CARE_PROVIDER_SITE_OTHER): Payer: Medicare Other | Admitting: Cardiology

## 2015-07-28 ENCOUNTER — Encounter: Payer: Self-pay | Admitting: Cardiology

## 2015-07-28 VITALS — BP 148/74 | HR 84 | Ht 60.0 in | Wt 232.0 lb

## 2015-07-28 DIAGNOSIS — E669 Obesity, unspecified: Secondary | ICD-10-CM

## 2015-07-28 MED ORDER — PRAVASTATIN SODIUM 40 MG PO TABS: 40.0000 mg | ORAL_TABLET | Freq: Every evening | ORAL | Status: AC

## 2015-07-28 NOTE — Assessment & Plan Note (Signed)
Discussed weight loss. 

## 2015-07-28 NOTE — Assessment & Plan Note (Signed)
Continue statin. 

## 2015-07-28 NOTE — Assessment & Plan Note (Addendum)
Blood pressure mildly elevatedBut patient is checking this at home and it is controlled typically. Continue present medications.

## 2015-07-28 NOTE — Patient Instructions (Signed)
Your physician recommends that you schedule a follow-up appointment in: AS NEEDED  

## 2016-06-26 ENCOUNTER — Other Ambulatory Visit: Payer: Self-pay | Admitting: Family Medicine

## 2016-06-26 ENCOUNTER — Ambulatory Visit
Admission: RE | Admit: 2016-06-26 | Discharge: 2016-06-26 | Disposition: A | Discharge status: Home / Self Care | Payer: Medicare Other | Source: Ambulatory Visit | Attending: Family Medicine | Admitting: Family Medicine

## 2016-06-26 DIAGNOSIS — R059 Cough, unspecified: Secondary | ICD-10-CM

## 2016-06-26 DIAGNOSIS — R05 Cough: Secondary | ICD-10-CM

## 2018-11-30 ENCOUNTER — Other Ambulatory Visit: Payer: Self-pay | Admitting: Family Medicine

## 2018-11-30 DIAGNOSIS — M25461 Effusion, right knee: Secondary | ICD-10-CM

## 2018-12-10 ENCOUNTER — Other Ambulatory Visit: Payer: Self-pay

## 2018-12-10 ENCOUNTER — Ambulatory Visit
Admission: RE | Admit: 2018-12-10 | Discharge: 2018-12-10 | Disposition: A | Discharge status: Home / Self Care | Payer: Medicare Other | Source: Ambulatory Visit | Attending: Family Medicine | Admitting: Family Medicine

## 2018-12-10 DIAGNOSIS — M25461 Effusion, right knee: Secondary | ICD-10-CM

## 2019-07-18 ENCOUNTER — Ambulatory Visit: Payer: Medicare Other | Attending: Internal Medicine

## 2019-07-18 ENCOUNTER — Ambulatory Visit: Payer: Medicare Other

## 2019-07-18 DIAGNOSIS — Z23 Encounter for immunization: Secondary | ICD-10-CM | POA: Insufficient documentation

## 2019-07-18 NOTE — Progress Notes (Signed)
   Covid-19 Vaccination Clinic  Name:  [name] [name]    MRN: [id number] DOB: July 31, 1940  07/18/2019  [name] was observed post Covid-19 immunization for 15 minutes without incidence. She was provided with Vaccine Information Sheet and instruction to access the V-Safe system.   [name] was instructed to call 911 with any severe reactions post vaccine: Marland Kitchen Difficulty breathing  . Swelling of your face and throat  . A fast heartbeat  . A bad rash all over your body  . Dizziness and weakness    Immunizations Administered    Name Date Dose VIS Date Route   Pfizer COVID-19 Vaccine 07/18/2019  1:16 PM 0.3 mL 05/14/2019 Intramuscular   Manufacturer: [place]   Lot: [id number]   [place]: [id number]

## 2019-08-10 ENCOUNTER — Ambulatory Visit: Payer: Medicare Other | Attending: Internal Medicine

## 2019-08-10 DIAGNOSIS — Z23 Encounter for immunization: Secondary | ICD-10-CM

## 2019-08-10 NOTE — Progress Notes (Signed)
   Covid-19 Vaccination Clinic  Name:  [name] [name]    MRN: [id number] DOB: 1940/08/12  08/10/2019  [name] was observed post Covid-19 immunization for 15 minutes without incident. She was provided with Vaccine Information Sheet and instruction to access the V-Safe system.   [name] was instructed to call 911 with any severe reactions post vaccine: Marland Kitchen Difficulty breathing  . Swelling of face and throat  . A fast heartbeat  . A bad rash all over body  . Dizziness and weakness   Immunizations Administered    Name Date Dose VIS Date Route   Pfizer COVID-19 Vaccine 08/10/2019 10:56 AM 0.3 mL 05/14/2019 Intramuscular   Manufacturer: [place]   Lot: [id number]   [place]: [id number]

## 2019-08-11 ENCOUNTER — Ambulatory Visit: Payer: Medicare Other

## 2020-07-06 DIAGNOSIS — N1832 Chronic kidney disease, stage 3b: Secondary | ICD-10-CM | POA: Diagnosis not present

## 2020-07-06 DIAGNOSIS — R7303 Prediabetes: Secondary | ICD-10-CM | POA: Diagnosis not present

## 2020-07-06 DIAGNOSIS — D649 Anemia, unspecified: Secondary | ICD-10-CM | POA: Diagnosis not present

## 2020-07-06 DIAGNOSIS — I10 Essential (primary) hypertension: Secondary | ICD-10-CM | POA: Diagnosis not present

## 2020-07-06 DIAGNOSIS — Z0000 Encounter for general adult medical examination without abnormal findings: Secondary | ICD-10-CM | POA: Diagnosis not present

## 2020-07-06 DIAGNOSIS — M85852 Other specified disorders of bone density and structure, left thigh: Secondary | ICD-10-CM | POA: Diagnosis not present

## 2020-07-06 DIAGNOSIS — R609 Edema, unspecified: Secondary | ICD-10-CM | POA: Diagnosis not present

## 2020-07-06 DIAGNOSIS — E559 Vitamin D deficiency, unspecified: Secondary | ICD-10-CM | POA: Diagnosis not present

## 2020-07-06 DIAGNOSIS — E782 Mixed hyperlipidemia: Secondary | ICD-10-CM | POA: Diagnosis not present

## 2020-07-06 DIAGNOSIS — M1711 Unilateral primary osteoarthritis, right knee: Secondary | ICD-10-CM | POA: Diagnosis not present

## 2020-07-06 DIAGNOSIS — Z8719 Personal history of other diseases of the digestive system: Secondary | ICD-10-CM | POA: Diagnosis not present

## 2020-07-07 DIAGNOSIS — E782 Mixed hyperlipidemia: Secondary | ICD-10-CM | POA: Diagnosis not present

## 2020-07-07 DIAGNOSIS — E559 Vitamin D deficiency, unspecified: Secondary | ICD-10-CM | POA: Diagnosis not present

## 2020-07-07 DIAGNOSIS — R7309 Other abnormal glucose: Secondary | ICD-10-CM | POA: Diagnosis not present

## 2020-07-07 DIAGNOSIS — D649 Anemia, unspecified: Secondary | ICD-10-CM | POA: Diagnosis not present

## 2020-08-22 DIAGNOSIS — D509 Iron deficiency anemia, unspecified: Secondary | ICD-10-CM | POA: Diagnosis not present

## 2020-08-22 DIAGNOSIS — M109 Gout, unspecified: Secondary | ICD-10-CM | POA: Diagnosis not present

## 2020-09-22 DIAGNOSIS — R609 Edema, unspecified: Secondary | ICD-10-CM | POA: Diagnosis not present

## 2020-09-22 DIAGNOSIS — M85852 Other specified disorders of bone density and structure, left thigh: Secondary | ICD-10-CM | POA: Diagnosis not present

## 2020-09-22 DIAGNOSIS — R7303 Prediabetes: Secondary | ICD-10-CM | POA: Diagnosis not present

## 2020-09-22 DIAGNOSIS — M1711 Unilateral primary osteoarthritis, right knee: Secondary | ICD-10-CM | POA: Diagnosis not present

## 2020-09-22 DIAGNOSIS — M109 Gout, unspecified: Secondary | ICD-10-CM | POA: Diagnosis not present

## 2020-09-22 DIAGNOSIS — D649 Anemia, unspecified: Secondary | ICD-10-CM | POA: Diagnosis not present

## 2020-09-22 DIAGNOSIS — I10 Essential (primary) hypertension: Secondary | ICD-10-CM | POA: Diagnosis not present

## 2020-09-22 DIAGNOSIS — E559 Vitamin D deficiency, unspecified: Secondary | ICD-10-CM | POA: Diagnosis not present

## 2020-09-22 DIAGNOSIS — E782 Mixed hyperlipidemia: Secondary | ICD-10-CM | POA: Diagnosis not present

## 2020-09-22 DIAGNOSIS — D229 Melanocytic nevi, unspecified: Secondary | ICD-10-CM | POA: Diagnosis not present

## 2020-09-22 DIAGNOSIS — N1832 Chronic kidney disease, stage 3b: Secondary | ICD-10-CM | POA: Diagnosis not present

## 2021-01-04 DIAGNOSIS — D649 Anemia, unspecified: Secondary | ICD-10-CM | POA: Diagnosis not present

## 2021-01-04 DIAGNOSIS — M109 Gout, unspecified: Secondary | ICD-10-CM | POA: Diagnosis not present

## 2021-01-04 DIAGNOSIS — R609 Edema, unspecified: Secondary | ICD-10-CM | POA: Diagnosis not present

## 2021-01-04 DIAGNOSIS — N1832 Chronic kidney disease, stage 3b: Secondary | ICD-10-CM | POA: Diagnosis not present

## 2021-01-04 DIAGNOSIS — M85852 Other specified disorders of bone density and structure, left thigh: Secondary | ICD-10-CM | POA: Diagnosis not present

## 2021-01-04 DIAGNOSIS — E559 Vitamin D deficiency, unspecified: Secondary | ICD-10-CM | POA: Diagnosis not present

## 2021-01-04 DIAGNOSIS — E782 Mixed hyperlipidemia: Secondary | ICD-10-CM | POA: Diagnosis not present

## 2021-01-04 DIAGNOSIS — M1711 Unilateral primary osteoarthritis, right knee: Secondary | ICD-10-CM | POA: Diagnosis not present

## 2021-01-04 DIAGNOSIS — R7303 Prediabetes: Secondary | ICD-10-CM | POA: Diagnosis not present

## 2021-01-04 DIAGNOSIS — I10 Essential (primary) hypertension: Secondary | ICD-10-CM | POA: Diagnosis not present

## 2021-01-04 DIAGNOSIS — Z8719 Personal history of other diseases of the digestive system: Secondary | ICD-10-CM | POA: Diagnosis not present

## 2021-05-20 DIAGNOSIS — Z03818 Encounter for observation for suspected exposure to other biological agents ruled out: Secondary | ICD-10-CM | POA: Diagnosis not present

## 2021-05-20 DIAGNOSIS — J209 Acute bronchitis, unspecified: Secondary | ICD-10-CM | POA: Diagnosis not present

## 2021-08-02 DIAGNOSIS — R600 Localized edema: Secondary | ICD-10-CM | POA: Diagnosis not present

## 2021-08-02 DIAGNOSIS — E782 Mixed hyperlipidemia: Secondary | ICD-10-CM | POA: Diagnosis not present

## 2021-08-02 DIAGNOSIS — N1832 Chronic kidney disease, stage 3b: Secondary | ICD-10-CM | POA: Diagnosis not present

## 2021-08-02 DIAGNOSIS — M85852 Other specified disorders of bone density and structure, left thigh: Secondary | ICD-10-CM | POA: Diagnosis not present

## 2021-08-02 DIAGNOSIS — R7303 Prediabetes: Secondary | ICD-10-CM | POA: Diagnosis not present

## 2021-08-02 DIAGNOSIS — I10 Essential (primary) hypertension: Secondary | ICD-10-CM | POA: Diagnosis not present

## 2021-08-02 DIAGNOSIS — Z0000 Encounter for general adult medical examination without abnormal findings: Secondary | ICD-10-CM | POA: Diagnosis not present

## 2021-08-02 DIAGNOSIS — E559 Vitamin D deficiency, unspecified: Secondary | ICD-10-CM | POA: Diagnosis not present

## 2021-08-02 DIAGNOSIS — Z1389 Encounter for screening for other disorder: Secondary | ICD-10-CM | POA: Diagnosis not present

## 2021-08-02 DIAGNOSIS — M109 Gout, unspecified: Secondary | ICD-10-CM | POA: Diagnosis not present

## 2021-08-02 DIAGNOSIS — D649 Anemia, unspecified: Secondary | ICD-10-CM | POA: Diagnosis not present

## 2021-08-02 DIAGNOSIS — Z23 Encounter for immunization: Secondary | ICD-10-CM | POA: Diagnosis not present

## 2021-08-08 ENCOUNTER — Other Ambulatory Visit: Payer: Self-pay | Admitting: Family Medicine

## 2021-08-08 DIAGNOSIS — M8580 Other specified disorders of bone density and structure, unspecified site: Secondary | ICD-10-CM

## 2021-08-24 ENCOUNTER — Ambulatory Visit: Admission: RE | Admit: 2021-08-24 | Payer: Medicare Other | Source: Ambulatory Visit

## 2021-08-24 ENCOUNTER — Other Ambulatory Visit: Payer: Self-pay

## 2021-08-24 ENCOUNTER — Ambulatory Visit
Admission: RE | Admit: 2021-08-24 | Discharge: 2021-08-24 | Disposition: A | Discharge status: Home / Self Care | Payer: Medicare Other | Source: Ambulatory Visit | Attending: Family Medicine | Admitting: Family Medicine

## 2021-08-24 DIAGNOSIS — Z780 Asymptomatic menopausal state: Secondary | ICD-10-CM | POA: Diagnosis not present

## 2021-08-24 DIAGNOSIS — M8580 Other specified disorders of bone density and structure, unspecified site: Secondary | ICD-10-CM

## 2021-08-24 DIAGNOSIS — M85852 Other specified disorders of bone density and structure, left thigh: Secondary | ICD-10-CM | POA: Diagnosis not present

## 2021-12-18 DIAGNOSIS — M65332 Trigger finger, left middle finger: Secondary | ICD-10-CM | POA: Diagnosis not present

## 2021-12-18 DIAGNOSIS — M65351 Trigger finger, right little finger: Secondary | ICD-10-CM | POA: Diagnosis not present

## 2022-01-15 DIAGNOSIS — M65342 Trigger finger, left ring finger: Secondary | ICD-10-CM | POA: Diagnosis not present

## 2022-01-15 DIAGNOSIS — M65351 Trigger finger, right little finger: Secondary | ICD-10-CM | POA: Diagnosis not present

## 2022-01-15 DIAGNOSIS — M65332 Trigger finger, left middle finger: Secondary | ICD-10-CM | POA: Diagnosis not present

## 2022-02-07 DIAGNOSIS — I10 Essential (primary) hypertension: Secondary | ICD-10-CM | POA: Diagnosis not present

## 2022-02-07 DIAGNOSIS — N1832 Chronic kidney disease, stage 3b: Secondary | ICD-10-CM | POA: Diagnosis not present

## 2022-02-07 DIAGNOSIS — E559 Vitamin D deficiency, unspecified: Secondary | ICD-10-CM | POA: Diagnosis not present

## 2022-02-07 DIAGNOSIS — M109 Gout, unspecified: Secondary | ICD-10-CM | POA: Diagnosis not present

## 2022-02-07 DIAGNOSIS — E782 Mixed hyperlipidemia: Secondary | ICD-10-CM | POA: Diagnosis not present

## 2022-02-07 DIAGNOSIS — M1711 Unilateral primary osteoarthritis, right knee: Secondary | ICD-10-CM | POA: Diagnosis not present

## 2022-02-07 DIAGNOSIS — D649 Anemia, unspecified: Secondary | ICD-10-CM | POA: Diagnosis not present

## 2022-02-07 DIAGNOSIS — R7303 Prediabetes: Secondary | ICD-10-CM | POA: Diagnosis not present

## 2022-02-07 DIAGNOSIS — Z23 Encounter for immunization: Secondary | ICD-10-CM | POA: Diagnosis not present

## 2022-02-07 DIAGNOSIS — R609 Edema, unspecified: Secondary | ICD-10-CM | POA: Diagnosis not present

## 2022-06-02 DIAGNOSIS — R051 Acute cough: Secondary | ICD-10-CM | POA: Diagnosis not present

## 2022-06-02 DIAGNOSIS — R0981 Nasal congestion: Secondary | ICD-10-CM | POA: Diagnosis not present

## 2022-06-02 DIAGNOSIS — U071 COVID-19: Secondary | ICD-10-CM | POA: Diagnosis not present

## 2022-06-02 DIAGNOSIS — J0181 Other acute recurrent sinusitis: Secondary | ICD-10-CM | POA: Diagnosis not present

## 2022-06-10 DIAGNOSIS — G479 Sleep disorder, unspecified: Secondary | ICD-10-CM | POA: Diagnosis not present

## 2022-06-10 DIAGNOSIS — I10 Essential (primary) hypertension: Secondary | ICD-10-CM | POA: Diagnosis not present

## 2022-06-10 DIAGNOSIS — U071 COVID-19: Secondary | ICD-10-CM | POA: Diagnosis not present

## 2022-06-10 DIAGNOSIS — J0100 Acute maxillary sinusitis, unspecified: Secondary | ICD-10-CM | POA: Diagnosis not present

## 2022-09-17 DIAGNOSIS — E559 Vitamin D deficiency, unspecified: Secondary | ICD-10-CM | POA: Diagnosis not present

## 2022-09-17 DIAGNOSIS — R7303 Prediabetes: Secondary | ICD-10-CM | POA: Diagnosis not present

## 2022-09-17 DIAGNOSIS — Z0000 Encounter for general adult medical examination without abnormal findings: Secondary | ICD-10-CM | POA: Diagnosis not present

## 2022-09-17 DIAGNOSIS — M1711 Unilateral primary osteoarthritis, right knee: Secondary | ICD-10-CM | POA: Diagnosis not present

## 2022-09-17 DIAGNOSIS — M109 Gout, unspecified: Secondary | ICD-10-CM | POA: Diagnosis not present

## 2022-09-17 DIAGNOSIS — I10 Essential (primary) hypertension: Secondary | ICD-10-CM | POA: Diagnosis not present

## 2022-09-17 DIAGNOSIS — R609 Edema, unspecified: Secondary | ICD-10-CM | POA: Diagnosis not present

## 2022-09-17 DIAGNOSIS — Z23 Encounter for immunization: Secondary | ICD-10-CM | POA: Diagnosis not present

## 2022-09-17 DIAGNOSIS — N1832 Chronic kidney disease, stage 3b: Secondary | ICD-10-CM | POA: Diagnosis not present

## 2022-09-17 DIAGNOSIS — D649 Anemia, unspecified: Secondary | ICD-10-CM | POA: Diagnosis not present

## 2022-09-17 DIAGNOSIS — E782 Mixed hyperlipidemia: Secondary | ICD-10-CM | POA: Diagnosis not present

## 2022-12-11 ENCOUNTER — Telehealth: Payer: Self-pay | Admitting: *Deleted

## 2022-12-11 NOTE — Telephone Encounter (Signed)
FMLA faxed.

## 2023-03-20 DIAGNOSIS — M1711 Unilateral primary osteoarthritis, right knee: Secondary | ICD-10-CM | POA: Diagnosis not present

## 2023-03-20 DIAGNOSIS — Z23 Encounter for immunization: Secondary | ICD-10-CM | POA: Diagnosis not present

## 2023-03-20 DIAGNOSIS — R609 Edema, unspecified: Secondary | ICD-10-CM | POA: Diagnosis not present

## 2023-03-20 DIAGNOSIS — E559 Vitamin D deficiency, unspecified: Secondary | ICD-10-CM | POA: Diagnosis not present

## 2023-03-20 DIAGNOSIS — N1832 Chronic kidney disease, stage 3b: Secondary | ICD-10-CM | POA: Diagnosis not present

## 2023-03-20 DIAGNOSIS — Z8719 Personal history of other diseases of the digestive system: Secondary | ICD-10-CM | POA: Diagnosis not present

## 2023-03-20 DIAGNOSIS — M109 Gout, unspecified: Secondary | ICD-10-CM | POA: Diagnosis not present

## 2023-03-20 DIAGNOSIS — M85852 Other specified disorders of bone density and structure, left thigh: Secondary | ICD-10-CM | POA: Diagnosis not present

## 2023-03-20 DIAGNOSIS — E782 Mixed hyperlipidemia: Secondary | ICD-10-CM | POA: Diagnosis not present

## 2023-03-20 DIAGNOSIS — R7303 Prediabetes: Secondary | ICD-10-CM | POA: Diagnosis not present

## 2023-03-20 DIAGNOSIS — D649 Anemia, unspecified: Secondary | ICD-10-CM | POA: Diagnosis not present

## 2023-03-20 DIAGNOSIS — I10 Essential (primary) hypertension: Secondary | ICD-10-CM | POA: Diagnosis not present

## 2023-10-17 DIAGNOSIS — N1832 Chronic kidney disease, stage 3b: Secondary | ICD-10-CM | POA: Diagnosis not present

## 2023-10-17 DIAGNOSIS — E559 Vitamin D deficiency, unspecified: Secondary | ICD-10-CM | POA: Diagnosis not present

## 2023-10-17 DIAGNOSIS — E782 Mixed hyperlipidemia: Secondary | ICD-10-CM | POA: Diagnosis not present

## 2023-10-17 DIAGNOSIS — M1711 Unilateral primary osteoarthritis, right knee: Secondary | ICD-10-CM | POA: Diagnosis not present

## 2023-10-17 DIAGNOSIS — M85852 Other specified disorders of bone density and structure, left thigh: Secondary | ICD-10-CM | POA: Diagnosis not present

## 2023-10-17 DIAGNOSIS — D649 Anemia, unspecified: Secondary | ICD-10-CM | POA: Diagnosis not present

## 2023-10-17 DIAGNOSIS — R609 Edema, unspecified: Secondary | ICD-10-CM | POA: Diagnosis not present

## 2023-10-17 DIAGNOSIS — M109 Gout, unspecified: Secondary | ICD-10-CM | POA: Diagnosis not present

## 2023-10-17 DIAGNOSIS — I10 Essential (primary) hypertension: Secondary | ICD-10-CM | POA: Diagnosis not present

## 2023-10-17 DIAGNOSIS — R7303 Prediabetes: Secondary | ICD-10-CM | POA: Diagnosis not present

## 2023-10-17 DIAGNOSIS — Z0000 Encounter for general adult medical examination without abnormal findings: Secondary | ICD-10-CM | POA: Diagnosis not present

## 2023-11-04 DIAGNOSIS — Z780 Asymptomatic menopausal state: Secondary | ICD-10-CM | POA: Diagnosis not present

## 2023-11-04 DIAGNOSIS — M85852 Other specified disorders of bone density and structure, left thigh: Secondary | ICD-10-CM | POA: Diagnosis not present

## 2023-12-17 DIAGNOSIS — H2513 Age-related nuclear cataract, bilateral: Secondary | ICD-10-CM | POA: Diagnosis not present

## 2024-02-26 DIAGNOSIS — H2513 Age-related nuclear cataract, bilateral: Secondary | ICD-10-CM | POA: Diagnosis not present

## 2024-03-11 DIAGNOSIS — I129 Hypertensive chronic kidney disease with stage 1 through stage 4 chronic kidney disease, or unspecified chronic kidney disease: Secondary | ICD-10-CM | POA: Diagnosis not present

## 2024-03-11 DIAGNOSIS — N183 Chronic kidney disease, stage 3 unspecified: Secondary | ICD-10-CM | POA: Diagnosis not present

## 2024-03-11 DIAGNOSIS — M1710 Unilateral primary osteoarthritis, unspecified knee: Secondary | ICD-10-CM | POA: Diagnosis not present

## 2024-03-11 DIAGNOSIS — H2512 Age-related nuclear cataract, left eye: Secondary | ICD-10-CM | POA: Diagnosis not present

## 2024-03-11 DIAGNOSIS — H25812 Combined forms of age-related cataract, left eye: Secondary | ICD-10-CM | POA: Diagnosis not present

## 2024-03-19 DIAGNOSIS — Z961 Presence of intraocular lens: Secondary | ICD-10-CM | POA: Diagnosis not present

## 2024-03-19 DIAGNOSIS — Z9842 Cataract extraction status, left eye: Secondary | ICD-10-CM | POA: Diagnosis not present

## 2024-03-25 DIAGNOSIS — H25811 Combined forms of age-related cataract, right eye: Secondary | ICD-10-CM | POA: Diagnosis not present

## 2024-03-25 DIAGNOSIS — H25812 Combined forms of age-related cataract, left eye: Secondary | ICD-10-CM | POA: Diagnosis not present
